# Patient Record
Sex: Female | Born: 1960 | Race: White | Hispanic: No | Marital: Married | Smoking: Never smoker
Health system: Southern US, Community
[De-identification: ages and names within clinical notes are randomized; demographics above are authoritative.]

## PROBLEM LIST (undated history)

## (undated) DIAGNOSIS — J45909 Unspecified asthma, uncomplicated: Secondary | ICD-10-CM

## (undated) DIAGNOSIS — T7840XA Allergy, unspecified, initial encounter: Secondary | ICD-10-CM

## (undated) DIAGNOSIS — R0602 Shortness of breath: Secondary | ICD-10-CM

## (undated) DIAGNOSIS — K219 Gastro-esophageal reflux disease without esophagitis: Secondary | ICD-10-CM

## (undated) HISTORY — PX: COLONOSCOPY: SHX174

## (undated) HISTORY — PX: TONSILLECTOMY: SUR1361

## (undated) HISTORY — DX: Allergy, unspecified, initial encounter: T78.40XA

## (undated) HISTORY — PX: CHOLECYSTECTOMY: SHX55

## (undated) HISTORY — PX: VARICOSE VEIN SURGERY: SHX832

---

## 1999-12-28 ENCOUNTER — Ambulatory Visit (HOSPITAL_COMMUNITY): Admission: RE | Admit: 1999-12-28 | Discharge: 1999-12-28 | Payer: Self-pay | Admitting: Geriatric Medicine

## 2000-06-23 ENCOUNTER — Emergency Department (HOSPITAL_COMMUNITY): Admission: EM | Admit: 2000-06-23 | Discharge: 2000-06-23 | Payer: Self-pay | Admitting: Emergency Medicine

## 2000-08-02 ENCOUNTER — Other Ambulatory Visit: Admission: RE | Admit: 2000-08-02 | Discharge: 2000-08-02 | Payer: Self-pay | Admitting: Obstetrics and Gynecology

## 2001-08-25 ENCOUNTER — Other Ambulatory Visit: Admission: RE | Admit: 2001-08-25 | Discharge: 2001-08-25 | Payer: Self-pay | Admitting: Obstetrics and Gynecology

## 2002-01-15 ENCOUNTER — Encounter: Payer: Self-pay | Admitting: Internal Medicine

## 2002-01-15 ENCOUNTER — Encounter: Admission: RE | Admit: 2002-01-15 | Discharge: 2002-01-15 | Payer: Self-pay | Admitting: Internal Medicine

## 2006-12-26 ENCOUNTER — Encounter: Admission: RE | Admit: 2006-12-26 | Discharge: 2006-12-26 | Payer: Self-pay | Admitting: Internal Medicine

## 2008-03-01 ENCOUNTER — Emergency Department (HOSPITAL_COMMUNITY): Admission: EM | Admit: 2008-03-01 | Discharge: 2008-03-01 | Payer: Self-pay | Admitting: Emergency Medicine

## 2008-03-10 ENCOUNTER — Ambulatory Visit: Payer: Self-pay | Admitting: Internal Medicine

## 2008-03-10 DIAGNOSIS — R933 Abnormal findings on diagnostic imaging of other parts of digestive tract: Secondary | ICD-10-CM

## 2008-03-10 DIAGNOSIS — R935 Abnormal findings on diagnostic imaging of other abdominal regions, including retroperitoneum: Secondary | ICD-10-CM

## 2008-03-10 DIAGNOSIS — R1084 Generalized abdominal pain: Secondary | ICD-10-CM | POA: Insufficient documentation

## 2008-03-23 ENCOUNTER — Ambulatory Visit: Payer: Self-pay | Admitting: Internal Medicine

## 2008-03-23 ENCOUNTER — Encounter: Payer: Self-pay | Admitting: Internal Medicine

## 2008-03-25 ENCOUNTER — Encounter: Payer: Self-pay | Admitting: Internal Medicine

## 2011-04-12 ENCOUNTER — Other Ambulatory Visit (HOSPITAL_COMMUNITY): Payer: Self-pay | Admitting: Internal Medicine

## 2011-04-12 DIAGNOSIS — Z1231 Encounter for screening mammogram for malignant neoplasm of breast: Secondary | ICD-10-CM

## 2011-04-19 ENCOUNTER — Ambulatory Visit (HOSPITAL_COMMUNITY)
Admission: RE | Admit: 2011-04-19 | Discharge: 2011-04-19 | Disposition: A | Discharge status: Home / Self Care | Payer: Self-pay | Source: Ambulatory Visit | Attending: Internal Medicine | Admitting: Internal Medicine

## 2011-04-19 DIAGNOSIS — Z1231 Encounter for screening mammogram for malignant neoplasm of breast: Secondary | ICD-10-CM

## 2011-07-04 LAB — URINALYSIS, ROUTINE W REFLEX MICROSCOPIC
Glucose, UA: NEGATIVE
Hgb urine dipstick: NEGATIVE
Specific Gravity, Urine: 1.025
Urobilinogen, UA: 0.2

## 2011-07-04 LAB — DIFFERENTIAL
Eosinophils Absolute: 0.3
Lymphocytes Relative: 21
Lymphs Abs: 2.5
Monocytes Relative: 7
Neutro Abs: 8.2 — ABNORMAL HIGH
Neutrophils Relative %: 69

## 2011-07-04 LAB — COMPREHENSIVE METABOLIC PANEL
ALT: 36 — ABNORMAL HIGH
AST: 18
Albumin: 4
Alkaline Phosphatase: 63
Calcium: 9.2
GFR calc Af Amer: >60
Glucose, Bld: 124 — ABNORMAL HIGH
Potassium: 4
Sodium: 141
Total Protein: 6.6

## 2011-07-04 LAB — GC/CHLAMYDIA PROBE AMP, GENITAL: GC Probe Amp, Genital: NEGATIVE

## 2011-07-04 LAB — WET PREP, GENITAL
Trich, Wet Prep: NONE SEEN
Yeast Wet Prep HPF POC: NONE SEEN

## 2011-07-04 LAB — CBC
Hemoglobin: 12.1
RBC: 4.01
RDW: 12.2

## 2011-07-04 LAB — POCT PREGNANCY, URINE
Operator id: 231701
Preg Test, Ur: NEGATIVE

## 2011-10-21 ENCOUNTER — Ambulatory Visit: Payer: Self-pay

## 2011-10-21 DIAGNOSIS — J209 Acute bronchitis, unspecified: Secondary | ICD-10-CM

## 2011-10-21 DIAGNOSIS — K219 Gastro-esophageal reflux disease without esophagitis: Secondary | ICD-10-CM

## 2012-04-29 ENCOUNTER — Other Ambulatory Visit (HOSPITAL_COMMUNITY): Payer: Self-pay | Admitting: Internal Medicine

## 2012-04-29 DIAGNOSIS — Z1231 Encounter for screening mammogram for malignant neoplasm of breast: Secondary | ICD-10-CM

## 2012-05-16 ENCOUNTER — Ambulatory Visit (HOSPITAL_COMMUNITY)
Admission: RE | Admit: 2012-05-16 | Discharge: 2012-05-16 | Disposition: A | Discharge status: Home / Self Care | Payer: BC Managed Care – PPO | Source: Ambulatory Visit | Attending: Internal Medicine | Admitting: Internal Medicine

## 2012-05-16 ENCOUNTER — Ambulatory Visit (HOSPITAL_COMMUNITY): Payer: Self-pay

## 2012-05-16 DIAGNOSIS — Z1231 Encounter for screening mammogram for malignant neoplasm of breast: Secondary | ICD-10-CM | POA: Insufficient documentation

## 2012-06-10 ENCOUNTER — Other Ambulatory Visit: Payer: Self-pay | Admitting: Obstetrics and Gynecology

## 2012-06-10 ENCOUNTER — Other Ambulatory Visit (HOSPITAL_COMMUNITY)
Admission: RE | Admit: 2012-06-10 | Discharge: 2012-06-10 | Disposition: A | Discharge status: Home / Self Care | Payer: BC Managed Care – PPO | Source: Ambulatory Visit | Attending: Obstetrics and Gynecology | Admitting: Obstetrics and Gynecology

## 2012-06-10 DIAGNOSIS — Z01419 Encounter for gynecological examination (general) (routine) without abnormal findings: Secondary | ICD-10-CM | POA: Insufficient documentation

## 2012-07-07 ENCOUNTER — Encounter (HOSPITAL_COMMUNITY): Payer: Self-pay | Admitting: Pharmacist

## 2012-07-15 MED FILL — Heparin Sodium (Porcine) Inj 5000 Unit/ML: INTRAMUSCULAR | Qty: 1 | Status: AC

## 2012-07-18 ENCOUNTER — Other Ambulatory Visit: Payer: Self-pay | Admitting: Obstetrics and Gynecology

## 2012-07-21 ENCOUNTER — Encounter (HOSPITAL_COMMUNITY)
Admission: RE | Admit: 2012-07-21 | Discharge: 2012-07-21 | Disposition: A | Discharge status: Home / Self Care | Payer: BC Managed Care – PPO | Source: Ambulatory Visit | Attending: Obstetrics and Gynecology | Admitting: Obstetrics and Gynecology

## 2012-07-21 ENCOUNTER — Encounter (HOSPITAL_COMMUNITY): Payer: Self-pay

## 2012-07-21 HISTORY — DX: Unspecified asthma, uncomplicated: J45.909

## 2012-07-21 HISTORY — DX: Shortness of breath: R06.02

## 2012-07-21 HISTORY — DX: Gastro-esophageal reflux disease without esophagitis: K21.9

## 2012-07-21 LAB — SURGICAL PCR SCREEN
MRSA, PCR: NEGATIVE
Staphylococcus aureus: NEGATIVE

## 2012-07-21 LAB — BASIC METABOLIC PANEL
BUN: 7 mg/dL (ref 6–23)
Calcium: 9.4 mg/dL (ref 8.4–10.5)
Creatinine, Ser: 0.65 mg/dL (ref 0.50–1.10)
GFR calc Af Amer: >90 mL/min (ref >90)
GFR calc non Af Amer: >90 mL/min (ref >90)

## 2012-07-21 LAB — CBC
MCH: 28.8 pg (ref 26.0–34.0)
MCHC: 32.7 g/dL (ref 30.0–36.0)
RDW: 13.2 % (ref 11.5–15.5)

## 2012-07-21 NOTE — Patient Instructions (Signed)
Your procedure is scheduled on:07/30/12  Enter through the Main Entrance at :6am Pick up desk phone and dial [phone] and inform us of your arrival.  Please call [phone] if you have any problems the morning of surgery.  Remember: Do not eat after midnight on Tuesday Do not drink after:midnight Tuesday  Take these meds the morning of surgery with a sip of water:none- bring inhaler to hospital  DO NOT wear jewelry, eye make-up, lipstick,body lotion, or dark fingernail polish. Do not shave for 48 hours prior to surgery.  If you are to be admitted after surgery, leave suitcase in car until your room has been assigned. Patients discharged on the day of surgery will not be allowed to drive home.   Remember to use your Hibiclens as instructed.

## 2012-07-30 ENCOUNTER — Ambulatory Visit (HOSPITAL_COMMUNITY): Payer: BC Managed Care – PPO | Admitting: Anesthesiology

## 2012-07-30 ENCOUNTER — Observation Stay (HOSPITAL_COMMUNITY)
Admission: RE | Admit: 2012-07-30 | Discharge: 2012-07-31 | Disposition: A | Discharge status: Home / Self Care | Payer: BC Managed Care – PPO | Source: Ambulatory Visit | Attending: Obstetrics and Gynecology | Admitting: Obstetrics and Gynecology

## 2012-07-30 ENCOUNTER — Encounter (HOSPITAL_COMMUNITY): Payer: Self-pay | Admitting: Anesthesiology

## 2012-07-30 ENCOUNTER — Encounter (HOSPITAL_COMMUNITY): Payer: Self-pay | Admitting: *Deleted

## 2012-07-30 ENCOUNTER — Encounter (HOSPITAL_COMMUNITY)
Admission: RE | Disposition: A | Discharge status: Home / Self Care | Payer: Self-pay | Source: Ambulatory Visit | Attending: Obstetrics and Gynecology

## 2012-07-30 DIAGNOSIS — N812 Incomplete uterovaginal prolapse: Principal | ICD-10-CM | POA: Insufficient documentation

## 2012-07-30 DIAGNOSIS — N8300 Follicular cyst of ovary, unspecified side: Secondary | ICD-10-CM | POA: Insufficient documentation

## 2012-07-30 HISTORY — PX: SALPINGOOPHORECTOMY: SHX82

## 2012-07-30 HISTORY — PX: VAGINAL HYSTERECTOMY: SHX2639

## 2012-07-30 LAB — PREGNANCY, URINE: Preg Test, Ur: NEGATIVE

## 2012-07-30 SURGERY — HYSTERECTOMY, VAGINAL
Anesthesia: General | Site: Vagina | Wound class: Clean Contaminated

## 2012-07-30 MED ORDER — CEFAZOLIN SODIUM-DEXTROSE 2-3 GM-% IV SOLR
2.0000 g | INTRAVENOUS | Status: AC
  Administered 2012-07-30: 2 g via INTRAVENOUS

## 2012-07-30 MED ORDER — FENTANYL CITRATE 0.05 MG/ML IJ SOLN
INTRAMUSCULAR | Status: AC
  Filled 2012-07-30: qty 2

## 2012-07-30 MED ORDER — FENTANYL CITRATE 0.05 MG/ML IJ SOLN
INTRAMUSCULAR | Status: AC
  Filled 2012-07-30: qty 5

## 2012-07-30 MED ORDER — GLYCOPYRROLATE 0.2 MG/ML IJ SOLN
INTRAMUSCULAR | Status: DC | PRN
  Administered 2012-07-30: 0.4 mg via INTRAVENOUS

## 2012-07-30 MED ORDER — PROMETHAZINE HCL 25 MG/ML IJ SOLN: 6.2500 mg | INTRAMUSCULAR | Status: DC | PRN

## 2012-07-30 MED ORDER — HYDROMORPHONE HCL PF 1 MG/ML IJ SOLN
INTRAMUSCULAR | Status: DC | PRN
  Administered 2012-07-30: 1 mg via INTRAVENOUS

## 2012-07-30 MED ORDER — KETOROLAC TROMETHAMINE 30 MG/ML IJ SOLN
INTRAMUSCULAR | Status: AC
  Filled 2012-07-30: qty 1

## 2012-07-30 MED ORDER — DIPHENHYDRAMINE HCL 50 MG/ML IJ SOLN: 12.5000 mg | Freq: Four times a day (QID) | INTRAMUSCULAR | Status: DC | PRN

## 2012-07-30 MED ORDER — LIDOCAINE HCL (CARDIAC) 20 MG/ML IV SOLN
INTRAVENOUS | Status: DC | PRN
  Administered 2012-07-30 (×2): 20 mg via INTRAVENOUS

## 2012-07-30 MED ORDER — CEFAZOLIN SODIUM-DEXTROSE 2-3 GM-% IV SOLR
INTRAVENOUS | Status: AC
  Filled 2012-07-30: qty 50

## 2012-07-30 MED ORDER — DOCUSATE SODIUM 100 MG PO CAPS
100.0000 mg | ORAL_CAPSULE | Freq: Two times a day (BID) | ORAL | Status: DC
  Administered 2012-07-30 – 2012-07-31 (×3): 100 mg via ORAL
  Filled 2012-07-30 (×3): qty 1

## 2012-07-30 MED ORDER — LACTATED RINGERS IV SOLN
INTRAVENOUS | Status: DC | PRN
  Administered 2012-07-30 (×2): via INTRAVENOUS

## 2012-07-30 MED ORDER — NEOSTIGMINE METHYLSULFATE 1 MG/ML IJ SOLN
INTRAMUSCULAR | Status: DC | PRN
  Administered 2012-07-30: 3 mg via INTRAVENOUS

## 2012-07-30 MED ORDER — DIPHENHYDRAMINE HCL 12.5 MG/5ML PO ELIX: 12.5000 mg | ORAL_SOLUTION | Freq: Four times a day (QID) | ORAL | Status: DC | PRN

## 2012-07-30 MED ORDER — LIDOCAINE-EPINEPHRINE 1 %-1:100000 IJ SOLN
INTRAMUSCULAR | Status: DC | PRN
  Administered 2012-07-30: 30 mL

## 2012-07-30 MED ORDER — ALBUTEROL SULFATE HFA 108 (90 BASE) MCG/ACT IN AERS: 2.0000 | INHALATION_SPRAY | RESPIRATORY_TRACT | Status: DC | PRN

## 2012-07-30 MED ORDER — NALOXONE HCL 0.4 MG/ML IJ SOLN: 0.4000 mg | INTRAMUSCULAR | Status: DC | PRN

## 2012-07-30 MED ORDER — SODIUM CHLORIDE 0.9 % IJ SOLN: 9.0000 mL | INTRAMUSCULAR | Status: DC | PRN

## 2012-07-30 MED ORDER — BISACODYL 10 MG RE SUPP: 10.0000 mg | Freq: Every day | RECTAL | Status: DC | PRN

## 2012-07-30 MED ORDER — PROPOFOL 10 MG/ML IV EMUL
INTRAVENOUS | Status: AC
  Filled 2012-07-30: qty 20

## 2012-07-30 MED ORDER — HYDROMORPHONE 0.3 MG/ML IV SOLN
INTRAVENOUS | Status: DC
  Administered 2012-07-30: 4 mL via INTRAVENOUS
  Administered 2012-07-30: 12:00:00 via INTRAVENOUS
  Filled 2012-07-30: qty 25

## 2012-07-30 MED ORDER — PROPOFOL 10 MG/ML IV EMUL
INTRAVENOUS | Status: DC | PRN
  Administered 2012-07-30: 200 mg via INTRAVENOUS
  Administered 2012-07-30: 100 mg via INTRAVENOUS

## 2012-07-30 MED ORDER — DEXAMETHASONE SODIUM PHOSPHATE 10 MG/ML IJ SOLN
INTRAMUSCULAR | Status: AC
  Filled 2012-07-30: qty 1

## 2012-07-30 MED ORDER — FENTANYL CITRATE 0.05 MG/ML IJ SOLN
INTRAMUSCULAR | Status: DC | PRN
  Administered 2012-07-30 (×3): 50 ug via INTRAVENOUS
  Administered 2012-07-30: 100 ug via INTRAVENOUS
  Administered 2012-07-30 (×2): 50 ug via INTRAVENOUS

## 2012-07-30 MED ORDER — ROCURONIUM BROMIDE 100 MG/10ML IV SOLN
INTRAVENOUS | Status: DC | PRN
  Administered 2012-07-30: 35 mg via INTRAVENOUS
  Administered 2012-07-30: 15 mg via INTRAVENOUS

## 2012-07-30 MED ORDER — LACTATED RINGERS IV SOLN
INTRAVENOUS | Status: DC
  Administered 2012-07-30 – 2012-07-31 (×2): via INTRAVENOUS

## 2012-07-30 MED ORDER — ONDANSETRON HCL 4 MG/2ML IJ SOLN
4.0000 mg | Freq: Four times a day (QID) | INTRAMUSCULAR | Status: DC | PRN
  Filled 2012-07-30: qty 2

## 2012-07-30 MED ORDER — LIDOCAINE HCL (CARDIAC) 20 MG/ML IV SOLN
INTRAVENOUS | Status: AC
  Filled 2012-07-30: qty 5

## 2012-07-30 MED ORDER — OXYCODONE-ACETAMINOPHEN 5-325 MG PO TABS: 1.0000 | ORAL_TABLET | ORAL | Status: DC | PRN

## 2012-07-30 MED ORDER — ONDANSETRON HCL 4 MG/2ML IJ SOLN
4.0000 mg | Freq: Four times a day (QID) | INTRAMUSCULAR | Status: DC | PRN
  Administered 2012-07-30 (×2): 4 mg via INTRAVENOUS
  Filled 2012-07-30: qty 2

## 2012-07-30 MED ORDER — ONDANSETRON HCL 4 MG PO TABS: 4.0000 mg | ORAL_TABLET | Freq: Four times a day (QID) | ORAL | Status: DC | PRN

## 2012-07-30 MED ORDER — MIDAZOLAM HCL 2 MG/2ML IJ SOLN
INTRAMUSCULAR | Status: AC
  Filled 2012-07-30: qty 2

## 2012-07-30 MED ORDER — NEOSTIGMINE METHYLSULFATE 1 MG/ML IJ SOLN
INTRAMUSCULAR | Status: AC
  Filled 2012-07-30: qty 10

## 2012-07-30 MED ORDER — ONDANSETRON HCL 4 MG/2ML IJ SOLN
INTRAMUSCULAR | Status: DC | PRN
  Administered 2012-07-30: 4 mg via INTRAVENOUS

## 2012-07-30 MED ORDER — ONDANSETRON HCL 4 MG/2ML IJ SOLN
INTRAMUSCULAR | Status: AC
  Filled 2012-07-30: qty 2

## 2012-07-30 MED ORDER — MIDAZOLAM HCL 2 MG/2ML IJ SOLN: 0.5000 mg | Freq: Once | INTRAMUSCULAR | Status: DC | PRN

## 2012-07-30 MED ORDER — MORPHINE SULFATE (PF) 1 MG/ML IV SOLN
INTRAVENOUS | Status: DC
  Administered 2012-07-30: 1 mg via INTRAVENOUS
  Administered 2012-07-30: 21:00:00 via INTRAVENOUS
  Administered 2012-07-31 (×2): 1 mg via INTRAVENOUS
  Filled 2012-07-30: qty 25

## 2012-07-30 MED ORDER — GLYCOPYRROLATE 0.2 MG/ML IJ SOLN
INTRAMUSCULAR | Status: AC
  Filled 2012-07-30: qty 2

## 2012-07-30 MED ORDER — LACTATED RINGERS IV SOLN
INTRAVENOUS | Status: DC
Start: 2012-07-30 — End: 2012-07-30
  Administered 2012-07-30 (×2): via INTRAVENOUS

## 2012-07-30 MED ORDER — FENTANYL CITRATE 0.05 MG/ML IJ SOLN: 25.0000 ug | INTRAMUSCULAR | Status: DC | PRN

## 2012-07-30 MED ORDER — HYDROMORPHONE HCL PF 1 MG/ML IJ SOLN
INTRAMUSCULAR | Status: AC
  Filled 2012-07-30: qty 1

## 2012-07-30 MED ORDER — KETOROLAC TROMETHAMINE 30 MG/ML IJ SOLN
30.0000 mg | Freq: Three times a day (TID) | INTRAMUSCULAR | Status: DC
  Administered 2012-07-30 – 2012-07-31 (×2): 30 mg via INTRAVENOUS
  Filled 2012-07-30 (×2): qty 1

## 2012-07-30 MED ORDER — DEXAMETHASONE SODIUM PHOSPHATE 4 MG/ML IJ SOLN
INTRAMUSCULAR | Status: DC | PRN
  Administered 2012-07-30: 10 mg via INTRAVENOUS

## 2012-07-30 MED ORDER — KETOROLAC TROMETHAMINE 30 MG/ML IJ SOLN: 15.0000 mg | Freq: Once | INTRAMUSCULAR | Status: DC | PRN

## 2012-07-30 MED ORDER — ONDANSETRON HCL 4 MG/2ML IJ SOLN
4.0000 mg | Freq: Four times a day (QID) | INTRAMUSCULAR | Status: DC | PRN
Start: 2012-07-30 — End: 2012-07-31

## 2012-07-30 MED ORDER — ESTRADIOL 0.1 MG/GM VA CREA
TOPICAL_CREAM | VAGINAL | Status: DC | PRN
  Administered 2012-07-30: 1 via VAGINAL

## 2012-07-30 MED ORDER — ESTRADIOL 0.1 MG/GM VA CREA
TOPICAL_CREAM | VAGINAL | Status: AC
  Filled 2012-07-30: qty 42.5

## 2012-07-30 MED ORDER — MIDAZOLAM HCL 5 MG/5ML IJ SOLN
INTRAMUSCULAR | Status: DC | PRN
  Administered 2012-07-30: 2 mg via INTRAVENOUS

## 2012-07-30 MED ORDER — ROCURONIUM BROMIDE 50 MG/5ML IV SOLN
INTRAVENOUS | Status: AC
  Filled 2012-07-30: qty 1

## 2012-07-30 MED ORDER — MEPERIDINE HCL 25 MG/ML IJ SOLN: 6.2500 mg | INTRAMUSCULAR | Status: DC | PRN

## 2012-07-30 MED ORDER — INFLUENZA VIRUS VACC SPLIT PF IM SUSP
0.5000 mL | INTRAMUSCULAR | Status: AC
  Administered 2012-07-31: 0.5 mL via INTRAMUSCULAR
  Filled 2012-07-30: qty 0.5

## 2012-07-30 SURGICAL SUPPLY — 32 items
CANISTER SUCTION 2500CC (MISCELLANEOUS) ×3 IMPLANT
CLOTH BEACON ORANGE TIMEOUT ST (SAFETY) ×3 IMPLANT
CONT PATH 16OZ SNAP LID 3702 (MISCELLANEOUS) ×1 IMPLANT
DECANTER SPIKE VIAL GLASS SM (MISCELLANEOUS) ×1 IMPLANT
DRAPE PROXIMA HALF (DRAPES) ×1 IMPLANT
DRAPE STERI URO 9X17 APER PCH (DRAPES) ×3 IMPLANT
DRESSING TELFA 8X3 (GAUZE/BANDAGES/DRESSINGS) ×3 IMPLANT
ELECT LIGASURE LONG (ELECTRODE) IMPLANT
ELECT LIGASURE SHORT 9 REUSE (ELECTRODE) ×1 IMPLANT
GLOVE BIOGEL M 6.5 STRL (GLOVE) ×6 IMPLANT
GLOVE BIOGEL PI IND STRL 6.5 (GLOVE) ×6 IMPLANT
GLOVE BIOGEL PI INDICATOR 6.5 (GLOVE) ×3
GOWN PREVENTION PLUS LG XLONG (DISPOSABLE) ×9 IMPLANT
GOWN PREVENTION PLUS XLARGE (GOWN DISPOSABLE) ×4 IMPLANT
GOWN STRL REIN XL XLG (GOWN DISPOSABLE) ×3 IMPLANT
NDL SPNL 22GX3.5 QUINCKE BK (NEEDLE) IMPLANT
NEEDLE HYPO 22GX1.5 SAFETY (NEEDLE) ×1 IMPLANT
NEEDLE SPNL 22GX3.5 QUINCKE BK (NEEDLE) ×3 IMPLANT
NS IRRIG 1000ML POUR BTL (IV SOLUTION) ×3 IMPLANT
PACK VAGINAL WOMENS (CUSTOM PROCEDURE TRAY) ×3 IMPLANT
PAD OB MATERNITY 4.3X12.25 (PERSONAL CARE ITEMS) ×3 IMPLANT
SUT CHROMIC 2 0 CT 1 (SUTURE) ×6 IMPLANT
SUT VIC AB 0 CT1 18XCR BRD8 (SUTURE) ×6 IMPLANT
SUT VIC AB 0 CT1 36 (SUTURE) ×6 IMPLANT
SUT VIC AB 0 CT1 8-18 (SUTURE) ×9
SUT VIC AB 2-0 CT1 (SUTURE) ×1 IMPLANT
SUT VIC AB 2-0 SH 27 (SUTURE) ×3
SUT VIC AB 2-0 SH 27XBRD (SUTURE) IMPLANT
SUT VICRYL 1 TIES 12X18 (SUTURE) ×3 IMPLANT
TOWEL OR 17X24 6PK STRL BLUE (TOWEL DISPOSABLE) ×6 IMPLANT
TRAY FOLEY CATH 14FR (SET/KITS/TRAYS/PACK) ×3 IMPLANT
WATER STERILE IRR 1000ML POUR (IV SOLUTION) ×2 IMPLANT

## 2012-07-30 NOTE — Anesthesia Postprocedure Evaluation (Signed)
  Anesthesia Post-op Note  Patient: Donna Campbell  Procedure(s) Performed: Procedure(s) (LRB) with comments: HYSTERECTOMY VAGINAL (N/A) SALPINGO OOPHERECTOMY (Left)  Patient Location: PACU  Anesthesia Type: General  Level of Consciousness: awake, alert  and oriented  Airway and Oxygen Therapy: Patient Spontanous Breathing  Post-op Pain: mild  Post-op Assessment: Post-op Vital signs reviewed, Patient's Cardiovascular Status Stable, Respiratory Function Stable, Patent Airway, No signs of Nausea or vomiting and Pain level controlled  Post-op Vital Signs: Reviewed and stable  Complications: No apparent anesthesia complications

## 2012-07-30 NOTE — Anesthesia Postprocedure Evaluation (Signed)
  Anesthesia Post-op Note  Patient: Donna Campbell  Procedure(s) Performed: Procedure(s) (LRB) with comments: HYSTERECTOMY VAGINAL (N/A) SALPINGO OOPHERECTOMY (Left)  Patient Location: [facility]  Anesthesia Type: General  Level of Consciousness: awake, alert  and oriented  Airway and Oxygen Therapy: Patient Spontanous Breathing and Patient connected to nasal cannula oxygen  Post-op Pain: mild  Post-op Assessment: Post-op Vital signs reviewed and Patient's Cardiovascular Status Stable  Post-op Vital Signs: Reviewed and stable  Complications: No apparent anesthesia complications

## 2012-07-30 NOTE — Addendum Note (Signed)
Addendum  created 07/30/12 1620 by Gertie Fey, CRNA   Modules edited:Notes Section

## 2012-07-30 NOTE — Op Note (Signed)
07/30/2012  11:22 AM  PATIENT:  Donna Campbell  51 y.o. female  PRE-OPERATIVE DIAGNOSIS:  Uterine Prolapse  POST-OPERATIVE DIAGNOSIS:  UTERINE PROLAPSE  PROCEDURE:  Procedure(s) (LRB) with comments: HYSTERECTOMY VAGINAL (N/A) SALPINGO OOPHERECTOMY (Left)  Anterior repair  SURGEON:  Surgeon(s) and Role:    Dorien Chihuahua. Richardson Dopp, MD - Primary    * Geryl Rankins, MD - Assisting  PHYSICIAN ASSISTANT:   ASSISTANTS: none   ANESTHESIA:   general  EBL:  Total I/O In: 1800 [I.V.:1800] Out: 500 [Urine:200; Blood:300]  BLOOD ADMINISTERED:none  DRAINS: Urinary Catheter (Foley)   LOCAL MEDICATIONS USED:  LIDOCAINE   SPECIMEN:  Source of Specimen:  uterus and cervix / left fallopian tube and ovary   DISPOSITION OF SPECIMEN:  PATHOLOGY  COUNTS:  YES  TOURNIQUET:  * No tourniquets in log *  DICTATION: .Dragon Dictation  PLAN OF CARE: Admit for overnight observation  PATIENT DISPOSITION:  PACU - hemodynamically stable.   Delay start of Pharmacological VTE agent (>24hrs) due to surgical blood loss or risk of bleeding: not applicable   Procedure: the patient was taken to the operating room placed under general anesthesia. Prepped and draped in the normal sterile fashion. A foley catheter was placed. A weighted speculum was placed in to the vagina and the cervix was grasped with a toothed tenaculum. The cervix was then injected circumferentially with 1% xylocaine with 1:100K of epinephrine. The cervix was then circumferentially incised with the scalpel and the bladder was dissected off the pubovesical cervical fascia. The anterior cul-de-sac as entered sharply. The same procedure was performed posteriorly and the posterior cu-lde-sac was entered sharply without difficulty. A heany clamp was placed over the uterosacral ligaments bilaterally., These were transected and suture ligated with 0 vicryl. The cardinal ligaments were then clamped bilaterally and transected and suture ligated in a  similar fashion.   The uterine arteries and the broad ligament were then serially clamped with ligasure  and transected  bilaterally. Excellent hemostasis was visualized. Both cornu were clamped with heany clamps, transected and the uterus was delivered. Theses pedicles were then suture ligated with excellent hemostasis.  The left fallopian tube and ovary were grasped with babcock clamp. The infundibulopelvic ligament was clamped with the ligasure and the left fallopian tube and ovary were excised .Marland Kitchen Excellent hemostasis was noted. .. The right fallopian tube and ovary were grasped with babcock.. There was minimal descent. A cyst on the left fallopian tube ruptured and the fluid was chocolate colored.. The right adnexa was irrigated copiously... Due to the lack of descent of the left tube and ovary, suspected scarring, I was not able to remove it safely.   The vaginal cuff angles were closed with an angle suture of 0 vicryl and transfixed to the ipsilateral uterosacral ligaments.  A McCall culdoplasty suture of 0 vicryl was placed. The peritoneum was re approximated with 2-0 chromic prior to tying the Mc calls stitch.   Attention was turned to anterior vaginal mucosa. Which was injected with 1% lidocaine with epinephrine.. The mucosa was undermined from the bladder with scissors and incised in the midline to 1 cm below the urethra. The pubovesical cervical fascial defects were re approximated with 2-0 vicryl. The redundant vaginal tissue was excised. The vaginal mucosa was re approximated with 0 vicryl. Vaginal packing with estrace was placed...   All instruments were removed from the vagina and the patient was taken to the recovery room awake and in stable condition.   Sponge lap and  needle counts were correct times 2.

## 2012-07-30 NOTE — Anesthesia Preprocedure Evaluation (Signed)
Anesthesia Evaluation  Patient identified by MRN, date of birth, ID band Patient awake    Reviewed: Allergy & Precautions, H&P , Patient's Chart, lab work & pertinent test results, reviewed documented beta blocker date and time   History of Anesthesia Complications Negative for: history of anesthetic complications  Airway Mallampati: III TM Distance: >3 FB Neck ROM: full    Dental No notable dental hx.    Pulmonary neg pulmonary ROS, shortness of breath, asthma ,  breath sounds clear to auscultation  Pulmonary exam normal       Cardiovascular Exercise Tolerance: Good negative cardio ROS  Rhythm:regular Rate:Normal     Neuro/Psych negative neurological ROS  negative psych ROS   GI/Hepatic negative GI ROS, Neg liver ROS, GERD-  Controlled,  Endo/Other  negative endocrine ROS  Renal/GU negative Renal ROS     Musculoskeletal   Abdominal   Peds  Hematology negative hematology ROS (+)   Anesthesia Other Findings Asthma     Shortness of breath   sometimes on exertion    GERD (gastroesophageal reflux disease)    Reproductive/Obstetrics negative OB ROS                           Anesthesia Physical Anesthesia Plan  ASA: II  Anesthesia Plan: General ETT   Post-op Pain Management:    Induction:   Airway Management Planned:   Additional Equipment:   Intra-op Plan:   Post-operative Plan:   Informed Consent: I have reviewed the patients History and Physical, chart, labs and discussed the procedure including the risks, benefits and alternatives for the proposed anesthesia with the patient or authorized representative who has indicated his/her understanding and acceptance.   Dental Advisory Given  Plan Discussed with: CRNA and Surgeon  Anesthesia Plan Comments:         Anesthesia Quick Evaluation

## 2012-07-30 NOTE — Transfer of Care (Signed)
Immediate Anesthesia Transfer of Care Note  Patient: Donna Campbell  Procedure(s) Performed: Procedure(s) (LRB) with comments: HYSTERECTOMY VAGINAL (N/A) SALPINGO OOPHERECTOMY (Left)  Patient Location: PACU  Anesthesia Type: General  Level of Consciousness: unresponsive  Airway & Oxygen Therapy: Patient Spontanous Breathing and Patient connected to nasal cannula oxygen  Post-op Assessment: Report given to PACU RN and Post -op Vital signs reviewed and stable  Post vital signs: Reviewed and stable  Complications: No apparent anesthesia complications

## 2012-07-30 NOTE — H&P (Signed)
Past Surgical History  Procedure Date  . Cholecystectomy   . Varicose vein surgery   . Tonsillectomy    Date of Initial H&P:07/18/2012 History reviewed, patient examined, no change in status, stable for surgery.

## 2012-07-31 ENCOUNTER — Encounter (HOSPITAL_COMMUNITY): Payer: Self-pay | Admitting: Obstetrics and Gynecology

## 2012-07-31 LAB — CBC
Hemoglobin: 9.8 g/dL — ABNORMAL LOW (ref 12.0–15.0)
MCH: 28.7 pg (ref 26.0–34.0)
MCHC: 32.5 g/dL (ref 30.0–36.0)

## 2012-07-31 MED ORDER — IBUPROFEN 600 MG PO TABS: 600.0000 mg | ORAL_TABLET | Freq: Four times a day (QID) | ORAL | Status: AC | PRN

## 2012-07-31 MED ORDER — OXYCODONE-ACETAMINOPHEN 5-325 MG PO TABS: 1.0000 | ORAL_TABLET | ORAL | Status: AC | PRN

## 2012-07-31 MED ORDER — IBUPROFEN 600 MG PO TABS: 600.0000 mg | ORAL_TABLET | Freq: Four times a day (QID) | ORAL | Status: DC | PRN

## 2012-07-31 MED ORDER — PNEUMOCOCCAL VAC POLYVALENT 25 MCG/0.5ML IJ INJ
0.5000 mL | INJECTION | Freq: Once | INTRAMUSCULAR | Status: AC
  Administered 2012-07-31: 0.5 mL via INTRAMUSCULAR
  Filled 2012-07-31: qty 0.5

## 2012-07-31 NOTE — Progress Notes (Signed)
Subjective: Patient reports nausea. Attributed to Dilaudid overnight.  Relieved with Zofran and now feels better this am.  Blood on vaginal packing when it was removed.  Pt reports some bleeding this am.  Pad that was just removed was minimally soiled.  Pt denies clots or constant bleeding.  Pain is well controlled.  Objective: I have reviewed patient's vital signs, intake and output and labs.  General: alert, cooperative and no distress Resp: clear to auscultation bilaterally Cardio: regular rate and rhythm, S1, S2 normal, no murmur, click, rub or gallop Extremities: extremities normal, atraumatic, no cyanosis or edema Vaginal Bleeding: minimal  Pad inspected and minimal dark blood noted.   Assessment/Plan: S/p TVH, anterior repair.  Doing well. Recommend holding discharge this am to observe vaginal bleeding. Advance to po pain meds. D/c home if bleeding is decreasing and pain is controlled with po pain medications. Pt already has a post op appt with Dr. Richardson Dopp.   LOS: 1 day    Sani Loiseau 07/31/2012, 12:32 PM

## 2012-07-31 NOTE — Progress Notes (Signed)
UR Chart review completed.  

## 2012-07-31 NOTE — Discharge Summary (Signed)
Physician Discharge Summary  Patient ID: Donna Campbell MRN: [id number] DOB/AGE: [birth year] 51 y.o.  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 Blood on vaginal packet noted and slowed by the time of discharge.  N/V with Dilaudid PCA overnight.  Consults: None  Significant Diagnostic Studies: labs: Hg. 9.8 post op  Treatments: surgery: TVH/anterior repair  Discharge Exam: Blood pressure 135/65, pulse 87, temperature 97.6 F (36.4 C), temperature source Oral, resp. rate 18, height 5\' 3"  (1.6 m), weight 97.977 kg (216 lb), SpO2 98.00%. Physical exam: WNL  Disposition: Final discharge disposition not confirmed  Discharge Orders    Future Orders Please Complete By Expires   Diet - low sodium heart healthy      Increase activity slowly      Discharge instructions      Comments:   See discharge instructions.   Sexual Activity Restrictions      Comments:   Pelvic rest   Lifting restrictions      Comments:   No heavy lifting x 3 months.   No wound care      Call MD for:  severe uncontrolled pain      Call MD for:  extreme fatigue      Call MD for:  persistant dizziness or light-headedness          Medication List     As of 07/31/2012 12:31 PM    TAKE these medications         albuterol 108 (90 BASE) MCG/ACT inhaler   Commonly known as: PROVENTIL HFA;VENTOLIN HFA   Inhale 2 puffs into the lungs every 4 (four) hours as needed. For shortness of breath      calcium carbonate 600 MG Tabs   Commonly known as: OS-CAL   Take 600 mg by mouth daily.      cetirizine 10 MG tablet   Commonly known as: ZYRTEC   Take 10 mg by mouth at bedtime.      cholecalciferol 1000 UNITS tablet   Commonly known as: VITAMIN D   Take 1,000 Units by mouth daily.      Coenzyme Q-10 100 MG capsule   Take 100 mg by mouth daily.      Cranberry 400 MG Caps   Take 2,400 mg by mouth daily.      ibuprofen 600 MG tablet   Commonly known as: ADVIL,MOTRIN   Take 1 tablet (600 mg total) by mouth every 6 (six) hours as needed.      multivitamin with minerals Tabs   Take 1 tablet by mouth daily.      oxyCODONE-acetaminophen 5-325 MG per tablet   Commonly known as: PERCOCET/ROXICET   Take 1-2 tablets by mouth every 4 (four) hours as needed (moderate to severe pain (when tolerating fluids)).      simvastatin 40 MG tablet   Commonly known as: ZOCOR   Take 40 mg by mouth every evening.      vitamin B-12 1000 MCG tablet   Commonly known as: CYANOCOBALAMIN   Take 1,000 mcg by mouth daily.           Follow-up Information    Follow up with Jessee Avers., MD. Schedule an appointment as soon as possible for a visit in 2 weeks. (Post op )    Contact information:   [address] [place] [address] [phone]          Signed: Geryl Rankins 07/31/2012, 12:31 PM

## 2012-07-31 NOTE — Progress Notes (Signed)
Discharge instructions reviewed with patient.  Patient states understanding of home care and signs/symptoms to report to MD.  Ambulated to car with staff without incident.  Discharged home with family.

## 2013-04-22 ENCOUNTER — Encounter: Payer: Self-pay | Admitting: Internal Medicine

## 2013-06-05 ENCOUNTER — Other Ambulatory Visit (HOSPITAL_COMMUNITY): Payer: Self-pay | Admitting: Internal Medicine

## 2013-06-05 DIAGNOSIS — Z1231 Encounter for screening mammogram for malignant neoplasm of breast: Secondary | ICD-10-CM

## 2013-06-12 ENCOUNTER — Ambulatory Visit (HOSPITAL_COMMUNITY)
Admission: RE | Admit: 2013-06-12 | Discharge: 2013-06-12 | Disposition: A | Discharge status: Home / Self Care | Payer: BC Managed Care – PPO | Source: Ambulatory Visit | Attending: Internal Medicine | Admitting: Internal Medicine

## 2013-06-12 DIAGNOSIS — Z1231 Encounter for screening mammogram for malignant neoplasm of breast: Secondary | ICD-10-CM

## 2014-04-28 ENCOUNTER — Encounter: Payer: Self-pay | Admitting: Internal Medicine

## 2014-08-03 ENCOUNTER — Other Ambulatory Visit (HOSPITAL_COMMUNITY): Payer: Self-pay | Admitting: Obstetrics and Gynecology

## 2014-08-03 DIAGNOSIS — Z1231 Encounter for screening mammogram for malignant neoplasm of breast: Secondary | ICD-10-CM

## 2014-08-13 ENCOUNTER — Ambulatory Visit (HOSPITAL_COMMUNITY)
Admission: RE | Admit: 2014-08-13 | Discharge: 2014-08-13 | Disposition: A | Discharge status: Home / Self Care | Payer: BC Managed Care – PPO | Source: Ambulatory Visit | Attending: Obstetrics and Gynecology | Admitting: Obstetrics and Gynecology

## 2014-08-13 DIAGNOSIS — Z1231 Encounter for screening mammogram for malignant neoplasm of breast: Secondary | ICD-10-CM | POA: Insufficient documentation

## 2015-02-15 ENCOUNTER — Ambulatory Visit (INDEPENDENT_AMBULATORY_CARE_PROVIDER_SITE_OTHER): Payer: BLUE CROSS/BLUE SHIELD | Admitting: Physician Assistant

## 2015-02-15 VITALS — BP 118/88 | HR 85 | Temp 97.7°F | Resp 18 | Ht 63.5 in | Wt 228.2 lb

## 2015-02-15 DIAGNOSIS — J069 Acute upper respiratory infection, unspecified: Secondary | ICD-10-CM

## 2015-02-15 DIAGNOSIS — R0981 Nasal congestion: Secondary | ICD-10-CM | POA: Diagnosis not present

## 2015-02-15 DIAGNOSIS — J309 Allergic rhinitis, unspecified: Secondary | ICD-10-CM | POA: Diagnosis not present

## 2015-02-15 MED ORDER — IPRATROPIUM BROMIDE 0.03 % NA SOLN: 2.0000 | Freq: Two times a day (BID) | NASAL | Status: AC

## 2015-02-15 NOTE — Patient Instructions (Addendum)
 10mg  of the cetirizine, you will take that daily.   Mucinex 1200mg  every 12 hours.   Drink plenty water (64oz per day which is about 4 regular sized water bottles)

## 2015-02-15 NOTE — Progress Notes (Signed)
[facility] [address], [place] [address] [phone]  Date:  02/15/2015   Name:  Donna Campbell   DOB:  12/17/1960   MRN:  [id number]  PCP:  Georgann HousekeeperHUSAIN,KARRAR, MD    Chief Complaint: Nasal Congestion; head congestion; and Sore Throat   History of Present Illness:  Donna Campbell is a 54 y.o. very pleasant female patient who presents with the following:   Throat pain with coughing that is worsened in the morning.  She has a wet cough with yellow sputum in the morning only.  She has no fever, chills, or fatigue.  There is no runny nose, but is congested.  She has no abdominal pain, nausea, or vomiting.  She drinks very little water, but mostly teas and sodas. She has allergies for which she treats symptomatically.  Patient Active Problem List   Diagnosis Date Noted  . 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 ABDOMINAL PAIN-GENERALIZED 03/10/2008  . ABNORMAL FINDINGS GI TRACT 03/10/2008  . NONSPEC ABN FIND-RADIOLOGY-OTH EXAM OF ABD AREA 03/10/2008    Past Medical History  Diagnosis Date  . Asthma   . Shortness of breath     sometimes on exertion  . GERD (gastroesophageal reflux disease)   . Allergy     Past Surgical History  Procedure Laterality Date  . Cholecystectomy    . Varicose vein surgery    . Tonsillectomy    . Vaginal hysterectomy  07/30/2012    Procedure: HYSTERECTOMY VAGINAL;  Surgeon: Dorien Chihuahuaara J. Richardson Doppole, MD;  Location: [facility];  Service: Gynecology;  Laterality: N/A;  . Salpingoophorectomy  07/30/2012    Procedure: SALPINGO OOPHERECTOMY;  Surgeon: Dorien Chihuahuaara J. Richardson Doppole, MD;  Location: [facility];  Service: Gynecology;  Laterality: Left;  . Colonoscopy      History  Substance Use Topics  . Smoking status: Never Smoker   . Smokeless tobacco: Not on file  . Alcohol Use: No    History reviewed. No pertinent family history.  Allergies  Allergen Reactions  . Naproxen Hives    Pt says she can take ibuprofen  . Sulfonamide  Derivatives Hives    Medication list has been reviewed and updated.  Current Outpatient Prescriptions on File Prior to Visit  Medication Sig Dispense Refill  . albuterol (PROVENTIL HFA;VENTOLIN HFA) 108 (90 BASE) MCG/ACT inhaler Inhale 2 puffs into the lungs every 4 (four) hours as needed. For shortness of breath    . calcium carbonate (OS-CAL) 600 MG TABS Take 600 mg by mouth daily.    . cetirizine (ZYRTEC) 10 MG tablet Take 10 mg by mouth at bedtime.    . cholecalciferol (VITAMIN D) 1000 UNITS tablet Take 1,000 Units by mouth daily.    . Coenzyme Q-10 100 MG capsule Take 100 mg by mouth daily.    . Cranberry 400 MG CAPS Take 2,400 mg by mouth daily.    . Multiple Vitamin (MULTIVITAMIN WITH MINERALS) TABS Take 1 tablet by mouth daily.    . simvastatin (ZOCOR) 40 MG tablet Take 40 mg by mouth every evening.    . vitamin B-12 (CYANOCOBALAMIN) 1000 MCG tablet Take 1,000 mcg by mouth daily.    Marland Kitchen. ibuprofen (ADVIL,MOTRIN) 600 MG tablet Take 1 tablet (600 mg total) by mouth every 6 (six) hours as needed. (Patient not taking: Reported on 02/15/2015) 30 tablet 1  . oxyCODONE-acetaminophen (PERCOCET/ROXICET) 5-325 MG per tablet Take 1-2 tablets by mouth every 4 (four) hours as needed (moderate to severe  pain (when tolerating fluids)). (Patient not taking: Reported on 02/15/2015) 30 tablet 0   No current facility-administered medications on file prior to visit.    Review of Systems: ROS otherwise unremarkable unless listed above.  Physical Examination: Filed Vitals:   02/15/15 1511  BP: 118/88  Pulse: 85  Temp: 97.7 F (36.5 C)  Resp: 18   Filed Vitals:   02/15/15 1511  Height: 5' 3.5" (1.613 m)  Weight: 228 lb 3.2 oz (103.511 kg)   Body mass index is 39.78 kg/(m^2). Ideal Body Weight: Weight in (lb) to have BMI = 25: 143.1  Physical Exam  Constitutional: She is oriented to person, place, and time. She appears well-developed and well-nourished. No distress.  HENT:  Head:  Normocephalic and atraumatic.  Right Ear: Tympanic membrane, external ear and ear canal normal.  Left Ear: Tympanic membrane, external ear and ear canal normal.  Nose: Mucosal edema and rhinorrhea present. Right sinus exhibits no maxillary sinus tenderness and no frontal sinus tenderness. Left sinus exhibits no maxillary sinus tenderness and no frontal sinus tenderness.  Mouth/Throat: No oropharyngeal exudate, posterior oropharyngeal edema or posterior oropharyngeal erythema.  Eyes: EOM are normal. Pupils are equal, round, and reactive to light. Right conjunctiva is not injected. Left conjunctiva is not injected.  Cardiovascular: Normal rate, regular rhythm and normal heart sounds.  Exam reveals no friction rub.   No murmur heard. Pulmonary/Chest: Effort normal and breath sounds normal. No respiratory distress. She has no wheezes.  Abdominal: Soft. Bowel sounds are normal. She exhibits no distension.  Lymphadenopathy:       Head (right side): No submandibular, no tonsillar and no preauricular adenopathy present.       Head (left side): No submandibular, no tonsillar and no preauricular adenopathy present.    She has no cervical adenopathy.  Neurological: She is alert and oriented to person, place, and time.  Skin: Skin is warm and dry.  Psychiatric: She has a normal mood and affect. Her behavior is normal.     Assessment and Plan: 54 year old female is here today for nasal congestion, head congestion, and sore throat. This appears to be allergic rhinitis, or viral URI.  She has cetirizine at home.  Advised to start 10 mg of cetirizine, and mucinex 1200mg  ever 12 hours.  Also advised hydration.  RTC if symptoms do not resolve in 4 days, or contact to start abx and/or further workup.   Allergic rhinitis, unspecified allergic rhinitis type  Viral URI  Nasal congestion - Plan: ipratropium (ATROVENT) 0.03 % nasal spray   Trena PlattStephanie Stephaie Dardis, PA-C [facility] 5/10/20168:57 PM

## 2015-08-11 ENCOUNTER — Other Ambulatory Visit: Payer: Self-pay

## 2015-08-11 DIAGNOSIS — Z1231 Encounter for screening mammogram for malignant neoplasm of breast: Secondary | ICD-10-CM

## 2015-09-06 ENCOUNTER — Ambulatory Visit
Admission: RE | Admit: 2015-09-06 | Discharge: 2015-09-06 | Disposition: A | Discharge status: Home / Self Care | Payer: PRIVATE HEALTH INSURANCE | Source: Ambulatory Visit

## 2015-09-06 DIAGNOSIS — Z1231 Encounter for screening mammogram for malignant neoplasm of breast: Secondary | ICD-10-CM

## 2016-04-16 DIAGNOSIS — Z1389 Encounter for screening for other disorder: Secondary | ICD-10-CM | POA: Diagnosis not present

## 2016-04-16 DIAGNOSIS — R7309 Other abnormal glucose: Secondary | ICD-10-CM | POA: Diagnosis not present

## 2016-04-16 DIAGNOSIS — Z0000 Encounter for general adult medical examination without abnormal findings: Secondary | ICD-10-CM | POA: Diagnosis not present

## 2016-04-16 DIAGNOSIS — Z23 Encounter for immunization: Secondary | ICD-10-CM | POA: Diagnosis not present

## 2016-04-16 DIAGNOSIS — E7800 Pure hypercholesterolemia, unspecified: Secondary | ICD-10-CM | POA: Diagnosis not present

## 2016-06-08 DIAGNOSIS — M25562 Pain in left knee: Secondary | ICD-10-CM | POA: Diagnosis not present

## 2016-06-08 DIAGNOSIS — M2242 Chondromalacia patellae, left knee: Secondary | ICD-10-CM | POA: Diagnosis not present

## 2016-06-08 DIAGNOSIS — M7122 Synovial cyst of popliteal space [Baker], left knee: Secondary | ICD-10-CM | POA: Diagnosis not present

## 2016-07-09 DIAGNOSIS — M2242 Chondromalacia patellae, left knee: Secondary | ICD-10-CM | POA: Diagnosis not present

## 2016-07-09 DIAGNOSIS — M25562 Pain in left knee: Secondary | ICD-10-CM | POA: Diagnosis not present

## 2016-07-20 DIAGNOSIS — M2242 Chondromalacia patellae, left knee: Secondary | ICD-10-CM | POA: Diagnosis not present

## 2016-07-24 DIAGNOSIS — M2242 Chondromalacia patellae, left knee: Secondary | ICD-10-CM | POA: Diagnosis not present

## 2016-07-27 DIAGNOSIS — M2242 Chondromalacia patellae, left knee: Secondary | ICD-10-CM | POA: Diagnosis not present

## 2016-07-31 DIAGNOSIS — M2242 Chondromalacia patellae, left knee: Secondary | ICD-10-CM | POA: Diagnosis not present

## 2016-08-03 DIAGNOSIS — M2242 Chondromalacia patellae, left knee: Secondary | ICD-10-CM | POA: Diagnosis not present

## 2016-08-07 DIAGNOSIS — M2242 Chondromalacia patellae, left knee: Secondary | ICD-10-CM | POA: Diagnosis not present

## 2016-08-10 ENCOUNTER — Other Ambulatory Visit: Payer: Self-pay | Admitting: Internal Medicine

## 2016-08-10 DIAGNOSIS — Z1231 Encounter for screening mammogram for malignant neoplasm of breast: Secondary | ICD-10-CM

## 2016-08-13 DIAGNOSIS — Z01419 Encounter for gynecological examination (general) (routine) without abnormal findings: Secondary | ICD-10-CM | POA: Diagnosis not present

## 2016-08-13 DIAGNOSIS — Z23 Encounter for immunization: Secondary | ICD-10-CM | POA: Diagnosis not present

## 2016-09-07 ENCOUNTER — Ambulatory Visit
Admission: RE | Admit: 2016-09-07 | Discharge: 2016-09-07 | Disposition: A | Discharge status: Home / Self Care | Payer: BLUE CROSS/BLUE SHIELD | Source: Ambulatory Visit | Attending: Internal Medicine | Admitting: Internal Medicine

## 2016-09-07 DIAGNOSIS — Z1231 Encounter for screening mammogram for malignant neoplasm of breast: Secondary | ICD-10-CM

## 2017-08-13 ENCOUNTER — Other Ambulatory Visit: Payer: Self-pay | Admitting: Internal Medicine

## 2017-08-13 DIAGNOSIS — Z1231 Encounter for screening mammogram for malignant neoplasm of breast: Secondary | ICD-10-CM

## 2017-08-14 DIAGNOSIS — Z01411 Encounter for gynecological examination (general) (routine) with abnormal findings: Secondary | ICD-10-CM | POA: Diagnosis not present

## 2017-08-14 DIAGNOSIS — N951 Menopausal and female climacteric states: Secondary | ICD-10-CM | POA: Diagnosis not present

## 2017-09-12 ENCOUNTER — Ambulatory Visit
Admission: RE | Admit: 2017-09-12 | Discharge: 2017-09-12 | Disposition: A | Discharge status: Home / Self Care | Payer: BLUE CROSS/BLUE SHIELD | Source: Ambulatory Visit | Attending: Internal Medicine | Admitting: Internal Medicine

## 2017-09-12 DIAGNOSIS — Z1231 Encounter for screening mammogram for malignant neoplasm of breast: Secondary | ICD-10-CM

## 2017-11-14 DIAGNOSIS — N951 Menopausal and female climacteric states: Secondary | ICD-10-CM | POA: Diagnosis not present

## 2018-08-08 ENCOUNTER — Other Ambulatory Visit: Payer: Self-pay | Admitting: Internal Medicine

## 2018-08-08 DIAGNOSIS — Z1231 Encounter for screening mammogram for malignant neoplasm of breast: Secondary | ICD-10-CM

## 2018-08-18 DIAGNOSIS — R300 Dysuria: Secondary | ICD-10-CM | POA: Diagnosis not present

## 2018-08-18 DIAGNOSIS — Z01419 Encounter for gynecological examination (general) (routine) without abnormal findings: Secondary | ICD-10-CM | POA: Diagnosis not present

## 2018-08-18 DIAGNOSIS — N951 Menopausal and female climacteric states: Secondary | ICD-10-CM | POA: Diagnosis not present

## 2018-08-18 DIAGNOSIS — R102 Pelvic and perineal pain: Secondary | ICD-10-CM | POA: Diagnosis not present

## 2018-08-26 DIAGNOSIS — Z0000 Encounter for general adult medical examination without abnormal findings: Secondary | ICD-10-CM | POA: Diagnosis not present

## 2018-08-26 DIAGNOSIS — E7800 Pure hypercholesterolemia, unspecified: Secondary | ICD-10-CM | POA: Diagnosis not present

## 2018-08-26 DIAGNOSIS — R7303 Prediabetes: Secondary | ICD-10-CM | POA: Diagnosis not present

## 2018-09-23 ENCOUNTER — Ambulatory Visit
Admission: RE | Admit: 2018-09-23 | Discharge: 2018-09-23 | Disposition: A | Discharge status: Home / Self Care | Payer: BLUE CROSS/BLUE SHIELD | Source: Ambulatory Visit | Attending: Internal Medicine | Admitting: Internal Medicine

## 2018-09-23 DIAGNOSIS — Z1231 Encounter for screening mammogram for malignant neoplasm of breast: Secondary | ICD-10-CM | POA: Diagnosis not present

## 2019-08-24 DIAGNOSIS — Z01419 Encounter for gynecological examination (general) (routine) without abnormal findings: Secondary | ICD-10-CM | POA: Diagnosis not present

## 2019-08-28 DIAGNOSIS — E559 Vitamin D deficiency, unspecified: Secondary | ICD-10-CM | POA: Diagnosis not present

## 2019-08-28 DIAGNOSIS — Z131 Encounter for screening for diabetes mellitus: Secondary | ICD-10-CM | POA: Diagnosis not present

## 2019-08-28 DIAGNOSIS — Z23 Encounter for immunization: Secondary | ICD-10-CM | POA: Diagnosis not present

## 2019-08-28 DIAGNOSIS — E7800 Pure hypercholesterolemia, unspecified: Secondary | ICD-10-CM | POA: Diagnosis not present

## 2019-08-28 DIAGNOSIS — Z0000 Encounter for general adult medical examination without abnormal findings: Secondary | ICD-10-CM | POA: Diagnosis not present

## 2019-09-16 ENCOUNTER — Other Ambulatory Visit: Payer: Self-pay | Admitting: Internal Medicine

## 2019-09-16 ENCOUNTER — Encounter: Payer: Self-pay | Admitting: Internal Medicine

## 2019-09-16 DIAGNOSIS — Z1231 Encounter for screening mammogram for malignant neoplasm of breast: Secondary | ICD-10-CM

## 2019-11-04 ENCOUNTER — Other Ambulatory Visit: Payer: Self-pay

## 2019-11-04 ENCOUNTER — Ambulatory Visit
Admission: RE | Admit: 2019-11-04 | Discharge: 2019-11-04 | Disposition: A | Discharge status: Home / Self Care | Payer: BC Managed Care – PPO | Source: Ambulatory Visit | Attending: Internal Medicine | Admitting: Internal Medicine

## 2019-11-04 DIAGNOSIS — Z1231 Encounter for screening mammogram for malignant neoplasm of breast: Secondary | ICD-10-CM | POA: Diagnosis not present

## 2020-04-04 DIAGNOSIS — J309 Allergic rhinitis, unspecified: Secondary | ICD-10-CM | POA: Diagnosis not present

## 2020-04-04 DIAGNOSIS — E7800 Pure hypercholesterolemia, unspecified: Secondary | ICD-10-CM | POA: Diagnosis not present

## 2020-04-04 DIAGNOSIS — R7303 Prediabetes: Secondary | ICD-10-CM | POA: Diagnosis not present

## 2020-04-04 DIAGNOSIS — F418 Other specified anxiety disorders: Secondary | ICD-10-CM | POA: Diagnosis not present

## 2020-04-26 DIAGNOSIS — U071 COVID-19: Secondary | ICD-10-CM | POA: Diagnosis not present

## 2020-05-01 ENCOUNTER — Emergency Department (HOSPITAL_COMMUNITY): Payer: BC Managed Care – PPO

## 2020-05-01 ENCOUNTER — Inpatient Hospital Stay (HOSPITAL_COMMUNITY): Payer: BC Managed Care – PPO

## 2020-05-01 ENCOUNTER — Inpatient Hospital Stay (HOSPITAL_COMMUNITY)
Admission: EM | Admit: 2020-05-01 | Discharge: 2020-06-08 | DRG: 207 | Disposition: E | Payer: BC Managed Care – PPO | Attending: Pulmonary Disease | Admitting: Pulmonary Disease

## 2020-05-01 ENCOUNTER — Other Ambulatory Visit: Payer: Self-pay

## 2020-05-01 DIAGNOSIS — D696 Thrombocytopenia, unspecified: Secondary | ICD-10-CM | POA: Diagnosis not present

## 2020-05-01 DIAGNOSIS — R918 Other nonspecific abnormal finding of lung field: Secondary | ICD-10-CM | POA: Diagnosis not present

## 2020-05-01 DIAGNOSIS — E870 Hyperosmolality and hypernatremia: Secondary | ICD-10-CM | POA: Diagnosis not present

## 2020-05-01 DIAGNOSIS — R21 Rash and other nonspecific skin eruption: Secondary | ICD-10-CM | POA: Diagnosis not present

## 2020-05-01 DIAGNOSIS — R001 Bradycardia, unspecified: Secondary | ICD-10-CM | POA: Diagnosis not present

## 2020-05-01 DIAGNOSIS — I21A1 Myocardial infarction type 2: Secondary | ICD-10-CM | POA: Diagnosis not present

## 2020-05-01 DIAGNOSIS — Z4682 Encounter for fitting and adjustment of non-vascular catheter: Secondary | ICD-10-CM | POA: Diagnosis not present

## 2020-05-01 DIAGNOSIS — J1282 Pneumonia due to coronavirus disease 2019: Secondary | ICD-10-CM | POA: Diagnosis present

## 2020-05-01 DIAGNOSIS — D649 Anemia, unspecified: Secondary | ICD-10-CM | POA: Diagnosis present

## 2020-05-01 DIAGNOSIS — J9601 Acute respiratory failure with hypoxia: Secondary | ICD-10-CM

## 2020-05-01 DIAGNOSIS — R739 Hyperglycemia, unspecified: Secondary | ICD-10-CM | POA: Diagnosis present

## 2020-05-01 DIAGNOSIS — R112 Nausea with vomiting, unspecified: Secondary | ICD-10-CM

## 2020-05-01 DIAGNOSIS — J439 Emphysema, unspecified: Secondary | ICD-10-CM | POA: Diagnosis not present

## 2020-05-01 DIAGNOSIS — Z4659 Encounter for fitting and adjustment of other gastrointestinal appliance and device: Secondary | ICD-10-CM

## 2020-05-01 DIAGNOSIS — I469 Cardiac arrest, cause unspecified: Secondary | ICD-10-CM | POA: Diagnosis not present

## 2020-05-01 DIAGNOSIS — J984 Other disorders of lung: Secondary | ICD-10-CM | POA: Diagnosis not present

## 2020-05-01 DIAGNOSIS — R0902 Hypoxemia: Secondary | ICD-10-CM

## 2020-05-01 DIAGNOSIS — Z66 Do not resuscitate: Secondary | ICD-10-CM | POA: Diagnosis not present

## 2020-05-01 DIAGNOSIS — J45909 Unspecified asthma, uncomplicated: Secondary | ICD-10-CM | POA: Diagnosis present

## 2020-05-01 DIAGNOSIS — E875 Hyperkalemia: Secondary | ICD-10-CM | POA: Diagnosis not present

## 2020-05-01 DIAGNOSIS — J15212 Pneumonia due to Methicillin resistant Staphylococcus aureus: Secondary | ICD-10-CM | POA: Diagnosis not present

## 2020-05-01 DIAGNOSIS — J9690 Respiratory failure, unspecified, unspecified whether with hypoxia or hypercapnia: Secondary | ICD-10-CM

## 2020-05-01 DIAGNOSIS — Z79899 Other long term (current) drug therapy: Secondary | ICD-10-CM

## 2020-05-01 DIAGNOSIS — E669 Obesity, unspecified: Secondary | ICD-10-CM | POA: Diagnosis present

## 2020-05-01 DIAGNOSIS — R0602 Shortness of breath: Secondary | ICD-10-CM | POA: Diagnosis not present

## 2020-05-01 DIAGNOSIS — U071 COVID-19: Secondary | ICD-10-CM | POA: Diagnosis not present

## 2020-05-01 DIAGNOSIS — Z452 Encounter for adjustment and management of vascular access device: Secondary | ICD-10-CM | POA: Diagnosis not present

## 2020-05-01 DIAGNOSIS — I4901 Ventricular fibrillation: Secondary | ICD-10-CM | POA: Diagnosis not present

## 2020-05-01 DIAGNOSIS — Z6839 Body mass index (BMI) 39.0-39.9, adult: Secondary | ICD-10-CM

## 2020-05-01 DIAGNOSIS — K219 Gastro-esophageal reflux disease without esophagitis: Secondary | ICD-10-CM | POA: Diagnosis present

## 2020-05-01 DIAGNOSIS — N170 Acute kidney failure with tubular necrosis: Secondary | ICD-10-CM | POA: Diagnosis not present

## 2020-05-01 DIAGNOSIS — Z01818 Encounter for other preprocedural examination: Secondary | ICD-10-CM

## 2020-05-01 DIAGNOSIS — Z9911 Dependence on respirator [ventilator] status: Secondary | ICD-10-CM

## 2020-05-01 DIAGNOSIS — Z888 Allergy status to other drugs, medicaments and biological substances status: Secondary | ICD-10-CM

## 2020-05-01 DIAGNOSIS — E8770 Fluid overload, unspecified: Secondary | ICD-10-CM | POA: Diagnosis not present

## 2020-05-01 DIAGNOSIS — I4891 Unspecified atrial fibrillation: Secondary | ICD-10-CM | POA: Diagnosis not present

## 2020-05-01 DIAGNOSIS — J9602 Acute respiratory failure with hypercapnia: Secondary | ICD-10-CM

## 2020-05-01 DIAGNOSIS — N179 Acute kidney failure, unspecified: Secondary | ICD-10-CM | POA: Diagnosis not present

## 2020-05-01 DIAGNOSIS — R0600 Dyspnea, unspecified: Secondary | ICD-10-CM | POA: Diagnosis not present

## 2020-05-01 DIAGNOSIS — Z0189 Encounter for other specified special examinations: Secondary | ICD-10-CM

## 2020-05-01 DIAGNOSIS — J9383 Other pneumothorax: Secondary | ICD-10-CM | POA: Diagnosis not present

## 2020-05-01 DIAGNOSIS — J80 Acute respiratory distress syndrome: Secondary | ICD-10-CM | POA: Diagnosis not present

## 2020-05-01 DIAGNOSIS — J9600 Acute respiratory failure, unspecified whether with hypoxia or hypercapnia: Secondary | ICD-10-CM | POA: Diagnosis not present

## 2020-05-01 DIAGNOSIS — B3324 Viral cardiomyopathy: Secondary | ICD-10-CM | POA: Diagnosis present

## 2020-05-01 DIAGNOSIS — J939 Pneumothorax, unspecified: Secondary | ICD-10-CM | POA: Diagnosis not present

## 2020-05-01 DIAGNOSIS — Z978 Presence of other specified devices: Secondary | ICD-10-CM

## 2020-05-01 DIAGNOSIS — J9811 Atelectasis: Secondary | ICD-10-CM | POA: Diagnosis not present

## 2020-05-01 DIAGNOSIS — N3289 Other specified disorders of bladder: Secondary | ICD-10-CM | POA: Diagnosis not present

## 2020-05-01 DIAGNOSIS — K760 Fatty (change of) liver, not elsewhere classified: Secondary | ICD-10-CM | POA: Diagnosis not present

## 2020-05-01 DIAGNOSIS — J189 Pneumonia, unspecified organism: Secondary | ICD-10-CM | POA: Diagnosis not present

## 2020-05-01 DIAGNOSIS — J90 Pleural effusion, not elsewhere classified: Secondary | ICD-10-CM | POA: Diagnosis not present

## 2020-05-01 DIAGNOSIS — Z882 Allergy status to sulfonamides status: Secondary | ICD-10-CM

## 2020-05-01 DIAGNOSIS — Z9689 Presence of other specified functional implants: Secondary | ICD-10-CM

## 2020-05-01 DIAGNOSIS — R1310 Dysphagia, unspecified: Secondary | ICD-10-CM

## 2020-05-01 LAB — CBC WITH DIFFERENTIAL/PLATELET
Abs Immature Granulocytes: 0.07 K/uL (ref 0.00–0.07)
Basophils Absolute: 0 K/uL (ref 0.0–0.1)
Basophils Relative: 0 %
Eosinophils Absolute: 0 K/uL (ref 0.0–0.5)
Eosinophils Relative: 0 %
HCT: 41.4 % (ref 36.0–46.0)
Hemoglobin: 13.7 g/dL (ref 12.0–15.0)
Immature Granulocytes: 1 %
Lymphocytes Relative: 23 %
Lymphs Abs: 2.3 K/uL (ref 0.7–4.0)
MCH: 29.7 pg (ref 26.0–34.0)
MCHC: 33.1 g/dL (ref 30.0–36.0)
MCV: 89.8 fL (ref 80.0–100.0)
Monocytes Absolute: 0.6 K/uL (ref 0.1–1.0)
Monocytes Relative: 6 %
Neutro Abs: 6.8 K/uL (ref 1.7–7.7)
Neutrophils Relative %: 70 %
Platelets: 323 K/uL (ref 150–400)
RBC: 4.61 MIL/uL (ref 3.87–5.11)
RDW: 13.9 % (ref 11.5–15.5)
WBC: 9.7 K/uL (ref 4.0–10.5)
nRBC: 0.2 % (ref 0.0–0.2)

## 2020-05-01 LAB — BLOOD GAS, ARTERIAL
Acid-base deficit: 9.1 mmol/L — ABNORMAL HIGH (ref 0.0–2.0)
Acid-base deficit: 8.2 mmol/L — ABNORMAL HIGH (ref 0.0–2.0)
Bicarbonate: 20 mmol/L (ref 20.0–28.0)
Bicarbonate: 17.4 mmol/L — ABNORMAL LOW (ref 20.0–28.0)
FIO2: 100
MECHVT: 330 mL
O2 Saturation: 76.3 %
O2 Saturation: 83.4 %
PEEP: 16 cmH2O
Patient temperature: 98.6
Patient temperature: 98.6
RATE: 26 {breaths}/min
pCO2 arterial: 57.7 mmHg — ABNORMAL HIGH (ref 32.0–48.0)
pCO2 arterial: 38.2 mmHg (ref 32.0–48.0)
pH, Arterial: 7.165 — CL (ref 7.350–7.450)
pH, Arterial: 7.282 — ABNORMAL LOW (ref 7.350–7.450)
pO2, Arterial: 58.8 mmHg — ABNORMAL LOW (ref 83.0–108.0)
pO2, Arterial: 58.5 mmHg — ABNORMAL LOW (ref 83.0–108.0)

## 2020-05-01 LAB — HIV ANTIBODY (ROUTINE TESTING W REFLEX): HIV Screen 4th Generation wRfx: NONREACTIVE

## 2020-05-01 LAB — COMPREHENSIVE METABOLIC PANEL
ALT: 61 U/L — ABNORMAL HIGH (ref 0–44)
AST: 89 U/L — ABNORMAL HIGH (ref 15–41)
Albumin: 3.5 g/dL (ref 3.5–5.0)
Alkaline Phosphatase: 76 U/L (ref 38–126)
Anion gap: 20 — ABNORMAL HIGH (ref 5–15)
BUN: 27 mg/dL — ABNORMAL HIGH (ref 6–20)
CO2: 15 mmol/L — ABNORMAL LOW (ref 22–32)
Calcium: 9.4 mg/dL (ref 8.9–10.3)
Chloride: 100 mmol/L (ref 98–111)
Creatinine, Ser: 1.14 mg/dL — ABNORMAL HIGH (ref 0.44–1.00)
GFR calc Af Amer: >60 mL/min (ref >60)
GFR calc non Af Amer: 53 mL/min — ABNORMAL LOW (ref >60)
Glucose, Bld: 224 mg/dL — ABNORMAL HIGH (ref 70–99)
Potassium: 3.9 mmol/L (ref 3.5–5.1)
Sodium: 135 mmol/L (ref 135–145)
Total Bilirubin: 0.5 mg/dL (ref 0.3–1.2)
Total Protein: 8.2 g/dL — ABNORMAL HIGH (ref 6.5–8.1)

## 2020-05-01 LAB — GLUCOSE, CAPILLARY: Glucose-Capillary: 166 mg/dL — ABNORMAL HIGH (ref 70–99)

## 2020-05-01 LAB — FIBRINOGEN: Fibrinogen: 712 mg/dL — ABNORMAL HIGH (ref 210–475)

## 2020-05-01 LAB — PHOSPHORUS: Phosphorus: 7.5 mg/dL — ABNORMAL HIGH (ref 2.5–4.6)

## 2020-05-01 LAB — PROCALCITONIN: Procalcitonin: 0.14 ng/mL

## 2020-05-01 LAB — MAGNESIUM: Magnesium: 2.4 mg/dL (ref 1.7–2.4)

## 2020-05-01 LAB — FERRITIN: Ferritin: 2369 ng/mL — ABNORMAL HIGH (ref 11–307)

## 2020-05-01 LAB — LACTATE DEHYDROGENASE: LDH: 904 U/L — ABNORMAL HIGH (ref 98–192)

## 2020-05-01 LAB — TROPONIN I (HIGH SENSITIVITY): Troponin I (High Sensitivity): 67 ng/L — ABNORMAL HIGH (ref <18)

## 2020-05-01 LAB — C-REACTIVE PROTEIN: CRP: 9.9 mg/dL — ABNORMAL HIGH

## 2020-05-01 LAB — BRAIN NATRIURETIC PEPTIDE: B Natriuretic Peptide: 84.6 pg/mL (ref 0.0–100.0)

## 2020-05-01 LAB — D-DIMER, QUANTITATIVE: D-Dimer, Quant: 2.73 ug/mL-FEU — ABNORMAL HIGH (ref 0.00–0.50)

## 2020-05-01 LAB — HEPATITIS B SURFACE ANTIGEN: Hepatitis B Surface Ag: NONREACTIVE

## 2020-05-01 MED ORDER — ROCURONIUM BROMIDE 10 MG/ML (PF) SYRINGE
PREFILLED_SYRINGE | INTRAVENOUS | Status: AC
  Filled 2020-05-01: qty 10

## 2020-05-01 MED ORDER — ETOMIDATE 2 MG/ML IV SOLN
INTRAVENOUS | Status: AC
  Filled 2020-05-01: qty 20

## 2020-05-01 MED ORDER — SODIUM CHLORIDE 0.9 % IV SOLN
100.0000 mg | INTRAVENOUS | Status: AC
  Filled 2020-05-01: qty 20

## 2020-05-01 MED ORDER — CHLORHEXIDINE GLUCONATE CLOTH 2 % EX PADS
6.0000 | MEDICATED_PAD | Freq: Every day | CUTANEOUS | Status: DC
  Administered 2020-05-01: 6 via TOPICAL

## 2020-05-01 MED ORDER — FENTANYL 2500MCG IN NS 250ML (10MCG/ML) PREMIX INFUSION
50.0000 ug/h | INTRAVENOUS | Status: DC
  Administered 2020-05-01: 50 ug/h via INTRAVENOUS
  Administered 2020-05-02: 75 ug/h via INTRAVENOUS
  Administered 2020-05-03: 225 ug/h via INTRAVENOUS
  Administered 2020-05-04: 300 ug/h via INTRAVENOUS
  Filled 2020-05-01 (×3): qty 250

## 2020-05-01 MED ORDER — VITAL HIGH PROTEIN PO LIQD: 1000.0000 mL | ORAL | Status: DC

## 2020-05-01 MED ORDER — TOCILIZUMAB 400 MG/20ML IV SOLN
800.0000 mg | Freq: Once | INTRAVENOUS | Status: AC
  Administered 2020-05-01: 800 mg via INTRAVENOUS
  Filled 2020-05-01: qty 40

## 2020-05-01 MED ORDER — FENTANYL CITRATE (PF) 100 MCG/2ML IJ SOLN
50.0000 ug | Freq: Once | INTRAMUSCULAR | Status: AC
  Administered 2020-05-01: 50 ug via INTRAVENOUS

## 2020-05-01 MED ORDER — ORAL CARE MOUTH RINSE
15.0000 mL | OROMUCOSAL | Status: DC
  Administered 2020-05-01 – 2020-05-21 (×191): 15 mL via OROMUCOSAL

## 2020-05-01 MED ORDER — MIDAZOLAM BOLUS VIA INFUSION
1.0000 mg | INTRAVENOUS | Status: DC | PRN
  Administered 2020-05-01: 1 mg via INTRAVENOUS
  Administered 2020-05-03 – 2020-05-08 (×6): 2 mg via INTRAVENOUS
  Administered 2020-05-08: 1 mg via INTRAVENOUS
  Administered 2020-05-09: 2 mg via INTRAVENOUS
  Administered 2020-05-10: 1 mg via INTRAVENOUS
  Filled 2020-05-01: qty 2

## 2020-05-01 MED ORDER — SODIUM CHLORIDE 0.9 % IV SOLN: 100.0000 mg | Freq: Every day | INTRAVENOUS | Status: DC

## 2020-05-01 MED ORDER — METHYLPREDNISOLONE SODIUM SUCC 125 MG IJ SOLR
60.0000 mg | Freq: Four times a day (QID) | INTRAMUSCULAR | Status: DC
  Administered 2020-05-01 – 2020-05-02 (×2): 60 mg via INTRAVENOUS
  Filled 2020-05-01: qty 2

## 2020-05-01 MED ORDER — INSULIN ASPART 100 UNIT/ML ~~LOC~~ SOLN
0.0000 [IU] | SUBCUTANEOUS | Status: DC
  Administered 2020-05-01: 4 [IU] via SUBCUTANEOUS
  Administered 2020-05-02: 7 [IU] via SUBCUTANEOUS
  Administered 2020-05-02: 15 [IU] via SUBCUTANEOUS
  Administered 2020-05-02 (×2): 7 [IU] via SUBCUTANEOUS
  Administered 2020-05-02: 4 [IU] via SUBCUTANEOUS
  Administered 2020-05-03: 7 [IU] via SUBCUTANEOUS
  Administered 2020-05-03 (×2): 4 [IU] via SUBCUTANEOUS
  Administered 2020-05-03: 7 [IU] via SUBCUTANEOUS
  Administered 2020-05-03: 11 [IU] via SUBCUTANEOUS
  Administered 2020-05-03 – 2020-05-04 (×2): 7 [IU] via SUBCUTANEOUS
  Administered 2020-05-04: 4 [IU] via SUBCUTANEOUS
  Administered 2020-05-04 (×4): 7 [IU] via SUBCUTANEOUS
  Administered 2020-05-05 (×2): 4 [IU] via SUBCUTANEOUS
  Administered 2020-05-05: 7 [IU] via SUBCUTANEOUS
  Administered 2020-05-05 (×2): 4 [IU] via SUBCUTANEOUS
  Administered 2020-05-05: 7 [IU] via SUBCUTANEOUS
  Administered 2020-05-05: 4 [IU] via SUBCUTANEOUS
  Administered 2020-05-06: 3 [IU] via SUBCUTANEOUS
  Administered 2020-05-06 (×2): 7 [IU] via SUBCUTANEOUS
  Administered 2020-05-06 (×2): 4 [IU] via SUBCUTANEOUS
  Administered 2020-05-07: 7 [IU] via SUBCUTANEOUS
  Administered 2020-05-07: 4 [IU] via SUBCUTANEOUS
  Administered 2020-05-07: 7 [IU] via SUBCUTANEOUS
  Administered 2020-05-07 (×2): 4 [IU] via SUBCUTANEOUS
  Administered 2020-05-07: 7 [IU] via SUBCUTANEOUS
  Administered 2020-05-08: 3 [IU] via SUBCUTANEOUS
  Administered 2020-05-08: 4 [IU] via SUBCUTANEOUS
  Administered 2020-05-08 (×2): 3 [IU] via SUBCUTANEOUS
  Administered 2020-05-08: 4 [IU] via SUBCUTANEOUS
  Administered 2020-05-08: 7 [IU] via SUBCUTANEOUS
  Administered 2020-05-09 (×2): 4 [IU] via SUBCUTANEOUS
  Administered 2020-05-09: 3 [IU] via SUBCUTANEOUS
  Administered 2020-05-09 – 2020-05-10 (×9): 4 [IU] via SUBCUTANEOUS
  Administered 2020-05-11: 3 [IU] via SUBCUTANEOUS
  Administered 2020-05-11: 4 [IU] via SUBCUTANEOUS
  Administered 2020-05-11: 7 [IU] via SUBCUTANEOUS
  Administered 2020-05-11: 4 [IU] via SUBCUTANEOUS
  Administered 2020-05-12 (×2): 3 [IU] via SUBCUTANEOUS
  Administered 2020-05-12: 4 [IU] via SUBCUTANEOUS
  Administered 2020-05-12: 7 [IU] via SUBCUTANEOUS
  Administered 2020-05-13 – 2020-05-14 (×7): 3 [IU] via SUBCUTANEOUS
  Administered 2020-05-14: 4 [IU] via SUBCUTANEOUS
  Administered 2020-05-14: 3 [IU] via SUBCUTANEOUS
  Administered 2020-05-15 (×4): 4 [IU] via SUBCUTANEOUS
  Administered 2020-05-15 (×2): 3 [IU] via SUBCUTANEOUS
  Administered 2020-05-15: 4 [IU] via SUBCUTANEOUS
  Administered 2020-05-16: 3 [IU] via SUBCUTANEOUS
  Administered 2020-05-16 – 2020-05-17 (×4): 4 [IU] via SUBCUTANEOUS
  Administered 2020-05-17: 7 [IU] via SUBCUTANEOUS
  Administered 2020-05-17 – 2020-05-18 (×4): 4 [IU] via SUBCUTANEOUS
  Administered 2020-05-18: 3 [IU] via SUBCUTANEOUS
  Administered 2020-05-18: 4 [IU] via SUBCUTANEOUS
  Administered 2020-05-18: 3 [IU] via SUBCUTANEOUS
  Administered 2020-05-18: 4 [IU] via SUBCUTANEOUS
  Administered 2020-05-19 (×2): 3 [IU] via SUBCUTANEOUS
  Administered 2020-05-20 (×4): 7 [IU] via SUBCUTANEOUS
  Administered 2020-05-20: 3 [IU] via SUBCUTANEOUS
  Administered 2020-05-20: 4 [IU] via SUBCUTANEOUS
  Administered 2020-05-21 (×2): 7 [IU] via SUBCUTANEOUS
  Filled 2020-05-01: qty 0.2

## 2020-05-01 MED ORDER — PROSOURCE TF PO LIQD
45.0000 mL | Freq: Two times a day (BID) | ORAL | Status: DC
  Administered 2020-05-01: 45 mL
  Filled 2020-05-01 (×2): qty 45

## 2020-05-01 MED ORDER — SUCCINYLCHOLINE CHLORIDE 200 MG/10ML IV SOSY
PREFILLED_SYRINGE | INTRAVENOUS | Status: AC
  Filled 2020-05-01: qty 10

## 2020-05-01 MED ORDER — MIDAZOLAM 50MG/50ML (1MG/ML) PREMIX INFUSION
2.0000 mg/h | INTRAVENOUS | Status: DC
  Administered 2020-05-01 – 2020-05-03 (×3): 2 mg/h via INTRAVENOUS
  Administered 2020-05-04: 5 mg/h via INTRAVENOUS
  Administered 2020-05-04: 9 mg/h via INTRAVENOUS
  Administered 2020-05-04 – 2020-05-05 (×5): 10 mg/h via INTRAVENOUS
  Administered 2020-05-05: 9 mg/h via INTRAVENOUS
  Administered 2020-05-05 – 2020-05-06 (×2): 10 mg/h via INTRAVENOUS
  Administered 2020-05-06: 9 mg/h via INTRAVENOUS
  Administered 2020-05-06 (×2): 10 mg/h via INTRAVENOUS
  Administered 2020-05-06: 9 mg/h via INTRAVENOUS
  Administered 2020-05-07 (×2): 10 mg/h via INTRAVENOUS
  Administered 2020-05-07: 9 mg/h via INTRAVENOUS
  Administered 2020-05-07: 10 mg/h via INTRAVENOUS
  Administered 2020-05-07: 9 mg/h via INTRAVENOUS
  Administered 2020-05-08: 10 mg/h via INTRAVENOUS
  Administered 2020-05-08: 9 mg/h via INTRAVENOUS
  Administered 2020-05-08: 10 mg/h via INTRAVENOUS
  Administered 2020-05-08: 9 mg/h via INTRAVENOUS
  Administered 2020-05-09 (×2): 8 mg/h via INTRAVENOUS
  Administered 2020-05-09: 9 mg/h via INTRAVENOUS
  Administered 2020-05-10: 8 mg/h via INTRAVENOUS
  Filled 2020-05-01 (×2): qty 50
  Filled 2020-05-01: qty 100
  Filled 2020-05-01 (×27): qty 50

## 2020-05-01 MED ORDER — PANTOPRAZOLE SODIUM 40 MG IV SOLR
40.0000 mg | Freq: Every day | INTRAVENOUS | Status: DC
  Administered 2020-05-01 – 2020-05-10 (×10): 40 mg via INTRAVENOUS
  Filled 2020-05-01 (×9): qty 40

## 2020-05-01 MED ORDER — ROCURONIUM BROMIDE 50 MG/5ML IV SOLN
INTRAVENOUS | Status: AC | PRN
  Administered 2020-05-01: 100 mg via INTRAVENOUS

## 2020-05-01 MED ORDER — SODIUM CHLORIDE 0.9 % IV SOLN: 200.0000 mg | Freq: Once | INTRAVENOUS | Status: DC

## 2020-05-01 MED ORDER — CHLORHEXIDINE GLUCONATE 0.12% ORAL RINSE (MEDLINE KIT)
15.0000 mL | Freq: Two times a day (BID) | OROMUCOSAL | Status: DC
  Administered 2020-05-02 – 2020-05-20 (×36): 15 mL via OROMUCOSAL

## 2020-05-01 MED ORDER — FENTANYL 2500MCG IN NS 250ML (10MCG/ML) PREMIX INFUSION
25.0000 ug/h | INTRAVENOUS | Status: DC
  Administered 2020-05-01: 25 ug/h via INTRAVENOUS
  Filled 2020-05-01: qty 250

## 2020-05-01 MED ORDER — ENOXAPARIN SODIUM 40 MG/0.4ML ~~LOC~~ SOLN
40.0000 mg | SUBCUTANEOUS | Status: DC
  Administered 2020-05-01: 40 mg via SUBCUTANEOUS
  Filled 2020-05-01: qty 0.4

## 2020-05-01 MED ORDER — VECURONIUM BOLUS VIA INFUSION
10.0000 mg | Freq: Once | INTRAVENOUS | Status: AC
  Administered 2020-05-01: 10 mg via INTRAVENOUS
  Filled 2020-05-01: qty 10

## 2020-05-01 MED ORDER — ORAL CARE MOUTH RINSE
15.0000 mL | OROMUCOSAL | Status: DC
  Administered 2020-05-01: 15 mL via OROMUCOSAL

## 2020-05-01 MED ORDER — FENTANYL BOLUS VIA INFUSION
50.0000 ug | INTRAVENOUS | Status: DC | PRN
  Administered 2020-05-01 – 2020-05-04 (×10): 50 ug via INTRAVENOUS
  Filled 2020-05-01: qty 50

## 2020-05-01 MED ORDER — PROPOFOL 500 MG/50ML IV EMUL
INTRAVENOUS | Status: AC | PRN
  Administered 2020-05-01: 5 ug via INTRAVENOUS

## 2020-05-01 MED ORDER — CHLORHEXIDINE GLUCONATE 0.12% ORAL RINSE (MEDLINE KIT)
15.0000 mL | Freq: Two times a day (BID) | OROMUCOSAL | Status: DC
  Administered 2020-05-01: 15 mL via OROMUCOSAL

## 2020-05-01 MED ORDER — PROPOFOL 500 MG/50ML IV EMUL
INTRAVENOUS | Status: AC
  Filled 2020-05-01: qty 50

## 2020-05-01 MED ORDER — ARTIFICIAL TEARS OPHTHALMIC OINT
1.0000 "application " | TOPICAL_OINTMENT | Freq: Three times a day (TID) | OPHTHALMIC | Status: DC
  Administered 2020-05-01 – 2020-05-04 (×9): 1 via OPHTHALMIC
  Filled 2020-05-01: qty 3.5

## 2020-05-01 MED ORDER — VECURONIUM BROMIDE 10 MG IV SOLR
0.0000 ug/kg/min | INTRAVENOUS | Status: DC
  Administered 2020-05-01: 1 ug/kg/min via INTRAVENOUS
  Administered 2020-05-03: 0.3 ug/kg/min via INTRAVENOUS
  Administered 2020-05-04: 1 ug/kg/min via INTRAVENOUS
  Filled 2020-05-01 (×2): qty 100

## 2020-05-01 MED ORDER — FENTANYL CITRATE (PF) 100 MCG/2ML IJ SOLN
INTRAMUSCULAR | Status: AC
  Filled 2020-05-01: qty 2

## 2020-05-01 MED ORDER — ETOMIDATE 2 MG/ML IV SOLN
INTRAVENOUS | Status: AC | PRN
  Administered 2020-05-01: 24 mg via INTRAVENOUS

## 2020-05-01 NOTE — ED Triage Notes (Signed)
Patient reports to the ER for being COVID positive. Patient reports she tested positive for COVID since Wednesday and became more SOB today. Patient was 43% on RA. Patient immediately brought back to a room and MD made aware. Patient pale and having trouble forming complete sentences.

## 2020-05-01 NOTE — Progress Notes (Signed)
Brief Nutrition Note  Consult received for enteral/tube feeding initiation and management.  Adult Enteral Nutrition Protocol initiated. Full assessment to follow.  Admitting Dx: Shortness of breath [R06.02] Encounter for imaging study to confirm orogastric (OG) tube placement [Z01.89] Acute respiratory failure with hypoxia (HCC) [J96.01] COVID-19 virus infection [U07.1] Acute respiratory distress syndrome (ARDS) due to COVID-19 virus (HCC) [U07.1, J80]  Body mass index is 38.49 kg/m. Pt meets criteria for obesity based on current BMI.  Labs:  Recent Labs  Lab 05-22-2020 1610  NA 135  K 3.9  CL 100  CO2 15*  BUN 27*  CREATININE 1.14*  CALCIUM 9.4  GLUCOSE 224*    Dawne Casali, MS, RD, LDN RD pager number and weekend/on-call pager number located in [place].

## 2020-05-01 NOTE — Procedures (Signed)
Central Venous Catheter Insertion Procedure Note  SHRITHA BRESEE  [id number]  [birth year]  Date:May 10, 2020  Time:6:38 PM   Provider Performing:Gerene Nedd   Procedure: Insertion of Non-tunneled Central Venous Catheter(36556) with US guidance (94076)   Indication(s) Medication administration and Difficult access  Consent Unable to obtain consent due to emergent nature of procedure.  Anesthesia Topical only with 1% lidocaine   Timeout Verified patient identification, verified procedure, site/side was marked, verified correct patient position, special equipment/implants available, medications/allergies/relevant history reviewed, required imaging and test results available.  Sterile Technique Maximal sterile technique including full sterile barrier drape, hand hygiene, sterile gown, sterile gloves, mask, hair covering, sterile ultrasound probe cover (if used).  Procedure Description Area of catheter insertion was cleaned with chlorhexidine and draped in sterile fashion.  With real-time ultrasound guidance a central venous catheter was placed into the left internal jugular vein. Nonpulsatile blood flow and easy flushing noted in all ports.  The catheter was sutured in place and sterile dressing applied.  Complications/Tolerance None; patient tolerated the procedure well. Chest X-ray is ordered to verify placement for internal jugular or subclavian cannulation.   Chest x-ray is not ordered for femoral cannulation.  EBL Minimal  Specimen(s) None  Grayden Burley MD [facility] Please see [email or web address] for pager details.  05/10/2020, 6:39 PM

## 2020-05-01 NOTE — ED Provider Notes (Signed)
[name] Provider Note   CSN: [id number] Arrival date & time: 04/30/2020  1522     History Chief Complaint  Patient presents with  . COVID Positive  . Respiratory Distress    Donna Campbell is a 59 y.o. female.  HPI   59yF with respiratory distress. History primarily from records. COVID+. Began having symptoms of fever and n/v/d on 04/23/20. Diagnosed with COVID on 04/26/20 at urgent care. Progressively worsening symptoms and significantly worse today. Severe dyspnea to point of not being able to speak intially. Initial o2 sats in 40s on RA. Only improving to 80s on NRB only able to speak 1-2 words at a time.    Past Medical History:  Diagnosis Date  . Allergy   . Asthma   . GERD (gastroesophageal reflux disease)   . Shortness of breath    sometimes on exertion    Patient Active Problem List   Diagnosis Date Noted  . 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 ABDOMINAL PAIN-GENERALIZED 03/10/2008  . ABNORMAL FINDINGS GI TRACT 03/10/2008  . NONSPEC ABN FIND-RADIOLOGY-OTH EXAM OF ABD AREA 03/10/2008    Past Surgical History:  Procedure Laterality Date  . CHOLECYSTECTOMY    . COLONOSCOPY    . SALPINGOOPHORECTOMY  07/30/2012   Procedure: SALPINGO OOPHERECTOMY;  Surgeon: Dorien Chihuahua. Richardson Dopp, MD;  Location: [facility];  Service: Gynecology;  Laterality: Left;  . TONSILLECTOMY    . VAGINAL HYSTERECTOMY  07/30/2012   Procedure: HYSTERECTOMY VAGINAL;  Surgeon: Dorien Chihuahua. Richardson Dopp, MD;  Location: [facility];  Service: Gynecology;  Laterality: N/A;  . VARICOSE VEIN SURGERY       OB History   No obstetric history on file.     No family history on file.  Social History   Tobacco Use  . Smoking status: Never Smoker  Substance Use Topics  . Alcohol use: No  . Drug use: No    Home Medications Prior to Admission medications   Medication Sig Start Date End Date Taking? Authorizing Provider  albuterol (PROVENTIL HFA;VENTOLIN  HFA) 108 (90 BASE) MCG/ACT inhaler Inhale 2 puffs into the lungs every 4 (four) hours as needed. For shortness of breath    [provider]  calcium carbonate (OS-CAL) 600 MG TABS Take 600 mg by mouth daily.    [provider]  cetirizine (ZYRTEC) 10 MG tablet Take 10 mg by mouth at bedtime.    [provider]  cholecalciferol (VITAMIN D) 1000 UNITS tablet Take 1,000 Units by mouth daily.    [provider]  Coenzyme Q-10 100 MG capsule Take 100 mg by mouth daily.    [provider]  Cranberry 400 MG CAPS Take 2,400 mg by mouth daily.    [provider]  ibuprofen (ADVIL,MOTRIN) 600 MG tablet Take 1 tablet (600 mg total) by mouth every 6 (six) hours as needed. Patient not taking: Reported on 02/15/2015 07/31/12   Geryl Rankins, MD  ipratropium (ATROVENT) 0.03 % nasal spray Place 2 sprays into both nostrils 2 (two) times daily. 02/15/15   Trena Platt D, PA  Multiple Vitamin (MULTIVITAMIN WITH MINERALS) TABS Take 1 tablet by mouth daily.    [provider]  nitrofurantoin (MACRODANTIN) 100 MG capsule Take 100 mg by mouth 4 (four) times daily.    [provider]  oxyCODONE-acetaminophen (PERCOCET/ROXICET) 5-325 MG per tablet Take 1-2 tablets by mouth every 4 (four) hours as needed (moderate to severe pain (when tolerating fluids)). Patient not taking:  Reported on 02/15/2015 07/31/12   Geryl Rankins, MD  simvastatin (ZOCOR) 40 MG tablet Take 40 mg by mouth every evening.    [provider]  vitamin B-12 (CYANOCOBALAMIN) 1000 MCG tablet Take 1,000 mcg by mouth daily.    [provider]    Allergies    Naproxen and Sulfonamide derivatives  Review of Systems   Review of Systems Level 5 caveat because of respiratory distress.  Physical Exam Updated Vital Signs Ht (P) 5\' 4"  (1.626 m)   LMP 07/17/2012   BMI (P) 39.17 kg/m   Physical Exam Vitals and nursing note reviewed.  Constitutional:       General: She is in acute distress.     Appearance: She is well-developed. She is obese. She is ill-appearing.  HENT:     Head: Normocephalic and atraumatic.  Eyes:     General:        Right eye: No discharge.        Left eye: No discharge.     Conjunctiva/sclera: Conjunctivae normal.  Cardiovascular:     Rate and Rhythm: Regular rhythm. Tachycardia present.     Heart sounds: Normal heart sounds. No murmur heard.  No friction rub. No gallop.   Pulmonary:     Effort: Respiratory distress present.     Comments: Breath sounds clear. Tachypnea.  Abdominal:     General: There is no distension.     Palpations: Abdomen is soft.     Tenderness: There is no abdominal tenderness.  Musculoskeletal:        General: No tenderness.     Cervical back: Neck supple.  Skin:    General: Skin is warm and dry.  Neurological:     Mental Status: She is alert.     Motor: No weakness.     ED Results / Procedures / Treatments   Labs (all labs ordered are listed, but only abnormal results are displayed) Labs Reviewed  D-DIMER, QUANTITATIVE (NOT AT [facility]) - Abnormal; Notable for the following components:      Result Value   D-Dimer, Quant 2.73 (*)    All other components within normal limits  FIBRINOGEN - Abnormal; Notable for the following components:   Fibrinogen 712 (*)    All other components within normal limits  BLOOD GAS, ARTERIAL - Abnormal; Notable for the following components:   pH, Arterial 7.165 (*)    pCO2 arterial 57.7 (*)    pO2, Arterial 58.8 (*)    Acid-base deficit 9.1 (*)    All other components within normal limits  CULTURE, BLOOD (ROUTINE X 2)  CULTURE, BLOOD (ROUTINE X 2)  CBC WITH DIFFERENTIAL/PLATELET  HIV ANTIBODY (ROUTINE TESTING W REFLEX)  BRAIN NATRIURETIC PEPTIDE  C-REACTIVE PROTEIN  COMPREHENSIVE METABOLIC PANEL  FERRITIN  HEPATITIS B SURFACE ANTIGEN  LACTATE DEHYDROGENASE  PROCALCITONIN  CREATININE, SERUM  CBC WITH DIFFERENTIAL/PLATELET  COMPREHENSIVE  METABOLIC PANEL  C-REACTIVE PROTEIN  D-DIMER, QUANTITATIVE (NOT AT [facility])  FERRITIN  MAGNESIUM  PHOSPHORUS  BLOOD GAS, ARTERIAL  ABO/RH  TYPE AND SCREEN  ABO/RH  TROPONIN I (HIGH SENSITIVITY)  TROPONIN I (HIGH SENSITIVITY)    EKG None  Radiology DG Abd 1 View  Result Date: 05/02/2020 CLINICAL DATA:  Orogastric tube placement. EXAM: ABDOMEN - 1 VIEW COMPARISON:  2020/05/11. FINDINGS: Distal tip of enteric tube is seen in the proximal stomach. No abnormal bowel dilatation is noted. IMPRESSION: Distal tip of enteric tube is seen in the proximal stomach. Electronically Signed   By:  Lupita Raider M.D.   On: 05/02/2020 14:40   DG CHEST PORT 1 VIEW  Result Date: 05/22/20 CLINICAL DATA:  59 year old female with acute respiratory distress. Central line placement. EXAM: PORTABLE CHEST 1 VIEW COMPARISON:  Earlier radiograph dated 22-May-2020. FINDINGS: Interval placement of a left IJ central venous line with tip over central SVC. Endotracheal tube remains above the carina and enteric tube extends below the diaphragm with tip beyond the inferior margin of the image. Diffuse bilateral airspace opacities as seen previously. No pleural effusion or pneumothorax. The cardiac silhouette is within limits. No acute osseous pathology. IMPRESSION: 1. Interval placement of a left IJ central venous line with tip over central SVC. No pneumothorax. 2. Bilateral pulmonary opacities. Electronically Signed   By: Elgie Collard M.D.   On: 05/22/20 19:24   Portable chest 1 View  Result Date: 05/22/2020 CLINICAL DATA:  59 year old female status post NG placement. EXAM: PORTABLE CHEST 1 VIEW COMPARISON:  Chest radiograph dated 06/19/2012. FINDINGS: Endotracheal tube with tip approximately 3 cm above the carina. Enteric tube extends below the diaphragm with tip and side-port in the left upper abdomen likely in the stomach. Bilateral confluent airspace opacities most consistent with pneumonia. Clinical correlation  and follow-up to resolution recommended. No large pleural effusion or pneumothorax. The cardiac silhouette is within limits. No acute osseous pathology. IMPRESSION: 1. Endotracheal tube above the carina. Enteric tube in the stomach. 2. Bilateral confluent airspace opacities most consistent with pneumonia. Electronically Signed   By: Elgie Collard M.D.   On: 05/22/20 16:57   DG Abd Portable 1V  Result Date: 05/22/20 CLINICAL DATA:  59 year old female status post NG placement. EXAM: PORTABLE CHEST 1 VIEW COMPARISON:  Chest radiograph dated 06/19/2012. FINDINGS: Endotracheal tube with tip approximately 3 cm above the carina. Enteric tube extends below the diaphragm with tip and side-port in the left upper abdomen likely in the stomach. Bilateral confluent airspace opacities most consistent with pneumonia. Clinical correlation and follow-up to resolution recommended. No large pleural effusion or pneumothorax. The cardiac silhouette is within limits. No acute osseous pathology. IMPRESSION: 1. Endotracheal tube above the carina. Enteric tube in the stomach. 2. Bilateral confluent airspace opacities most consistent with pneumonia. Electronically Signed   By: Elgie Collard M.D.   On: 05/22/20 16:57    Procedures OG placement  Date/Time: 05/22/20 4:00 PM Performed by: Raeford Razor, MD Authorized by: Raeford Razor, MD  Local anesthesia used: no  Anesthesia: Local anesthesia used: no  Sedation: Patient sedated: yes Sedatives: etomidate Vitals: Vital signs were monitored during sedation.  Patient tolerance: patient tolerated the procedure well with no immediate complications Comments: OG placed after airway secured.     INTUBATION Performed by: Raeford Razor  Required items: required blood products, implants, devices, and special equipment available Patient identity confirmed: provided demographic data and hospital-assigned identification number Time out: Immediately prior to  procedure a "time out" was called to verify the correct patient, procedure, equipment, support staff and site/side marked as required.  Indications: respiratory failure  Intubation method: Glidescope Laryngoscopy   Preoxygenation: NRB  Sedatives: Etomidate Paralytic: Rocuronium  Tube Size: 7.5 cuffed  Post-procedure assessment: chest rise and ETCO2 monitor Breath sounds: equal and absent over the epigastrium Tube secured with: ETT holder Chest x-ray interpreted by radiologist and me.  Chest x-ray findings: endotracheal tube in appropriate position  Patient tolerated the procedure well with no immediate complications.  CRITICAL CARE Performed by: Raeford Razor Total critical care time: 45 minutes Critical care time was  exclusive of separately billable procedures and treating other patients. Critical care was necessary to treat or prevent imminent or life-threatening deterioration. Critical care was time spent personally by me on the following activities: development of treatment plan with patient and/or surrogate as well as nursing, discussions with consultants, evaluation of patient's response to treatment, examination of patient, obtaining history from patient or surrogate, ordering and performing treatments and interventions, ordering and review of laboratory studies, ordering and review of radiographic studies, pulse oximetry and re-evaluation of patient's condition.    Medications Ordered in ED Medications  succinylcholine (ANECTINE) 200 MG/10ML syringe (  Not Given 2020/04/15 1631)  rocuronium bromide 100 MG/10ML SOSY (has no administration in time range)  fentaNYL 2500mcg in NS 250mL (2710mcg/ml) infusion-PREMIX (has no administration in time range)  fentaNYL (SUBLIMAZE) injection 50 mcg (50 mcg Intravenous Given 2020/04/15 1647)  etomidate (AMIDATE) injection (24 mg Intravenous Given 2020/04/15 1553)  rocuronium (ZEMURON) injection (100 mg Intravenous Given 2020/04/15 1554)  propofol  (DIPRIVAN) 500 MG/50ML infusion (5 mcg Intravenous New Bag/Given 2020/04/15 1600)    ED Course  I have reviewed the triage vital signs and the nursing notes.  Pertinent labs & imaging results that were available during my care of the patient were reviewed by me and considered in my medical decision making (see chart for details).    MDM Rules/Calculators/A&P                          59yF with respiratory failure. Presumed from COVID. Remained hypoxic and with significant work of breathing despite NRB. Intubated. Discussed with CCM. Admit.   Donna Campbell was evaluated in Emergency Department on 09-05-20 for the symptoms described in the history of present illness. She was evaluated in the context of the global COVID-19 pandemic, which necessitated consideration that the patient might be at risk for infection with the SARS-CoV-2 virus that causes COVID-19. Institutional protocols and algorithms that pertain to the evaluation of patients at risk for COVID-19 are in a state of rapid change based on information released by regulatory bodies including the CDC and federal and state organizations. These policies and algorithms were followed during the patient's care in the ED.   Final Clinical Impression(s) / ED Diagnoses Final diagnoses:  Shortness of breath  Acute respiratory failure with hypoxia (HCC)  COVID-19 virus infection    Rx / DC Orders ED Discharge Orders    None       Raeford RazorKohut, Sevastian Witczak, MD 05/02/20 [phone]

## 2020-05-01 NOTE — ED Notes (Addendum)
Staff present for intubation: Kohut MD, this RN, Jester Resp therapist  20G RAC  Sedation start 1552  24mg  Etomidate 1553 100mg  Roc 1554  Intubation with color change ETT 7.5 1556 24@lip   OG tube placed 16Fr 1600  Propofol 1600

## 2020-05-01 NOTE — H&P (Signed)
NAME:  Donna Campbell, MRN:  [id number], DOB:  Oct 10, 1960, LOS: 0 ADMISSION DATE:  05-16-2020, CONSULTATION DATE:  05-16-20 REFERRING MD:  EDP, CHIEF COMPLAINT: ARDS, COVID-19 pneumonia  Brief History   59 year old unvaccinated patient with history of asthma, obesity Diagnosed with COVID-19 on 5/21.  Admitted with progressive hypoxia.  Intubated in the ED with sats in the 30s.  PCCM called for admission  Past Medical History    has a past medical history of Allergy, Asthma, GERD (gastroesophageal reflux disease), and Shortness of breath.  Significant Hospital Events   7/25 Admit  Consults:  PCCM  Procedures:  ETT 7/25 Lt CVL 7/25  Significant Diagnostic Tests:  Chest x-ray 7/25-diffuse bilateral interstitial infiltrate  Micro Data:  Blood culture  Antimicrobials/COVID Rx  Remdesivir Actemra Solu-Medrol  Interim history/subjective:    Objective   Blood pressure (!) 188/110, pulse (!) 130, temperature (!) 101.9 F (38.8 C), resp. rate (!) 33, height 5\' 4"  (1.626 m), last menstrual period 07/17/2012, SpO2 (!) 80 %.    Vent Mode: PRVC FiO2 (%):  [100 %] 100 % Set Rate:  [26 bmp-35 bmp] 35 bmp Vt Set:  [330 mL] 330 mL PEEP:  [16 cmH20-18 cmH20] 18 cmH20  No intake or output data in the 24 hours ending May 16, 2020 1832 There were no vitals filed for this visit.  Examination: Gen:      No acute distress HEENT:  EOMI, sclera anicteric, ETT Neck:     No masses; no thyromegaly Lungs:    Clear to auscultation bilaterally; normal respiratory effort CV:         Regular rate and rhythm; no murmurs Abd:      + bowel sounds; soft, non-tender; no palpable masses, no distension Ext:    No edema; adequate peripheral perfusion Skin:      Warm and dry; no rash Neuro: Sedated, unresponsive  Resolved Hospital Problem list     Assessment & Plan:  Severe ARDS, COVID-19 pneumonia 6 cc/kg tidal volume ventilation Start paralysis, proning Remdesivir, Actemra, steroids.  Discussed  off label use of Actemra with husband and he agreed to treatment.  Hyperglycemia SSI coverage  Goals of care Husband requested full code  Best practice:  Diet: NPO Pain/Anxiety/Delirium protocol (if indicated): Fentanyl, Versed drip VAP protocol (if indicated): Ordered DVT prophylaxis: Lovenox GI prophylaxis: PPI Glucose control: SSI Mobility: Bed Code Status: Full Family Communication: Husband updated 7/25 Disposition: ICU  Labs   CBC: Recent Labs  Lab 2020/05/16 1610  WBC 9.7  NEUTROABS 6.8  HGB 13.7  HCT 41.4  MCV 89.8  PLT 323    Basic Metabolic Panel: Recent Labs  Lab 05-16-2020 1610  NA 135  K 3.9  CL 100  CO2 15*  GLUCOSE 224*  BUN 27*  CREATININE 1.14*  CALCIUM 9.4   GFR: CrCl cannot be calculated (Unknown ideal weight.). Recent Labs  Lab 2020/05/16 1610  PROCALCITON 0.14  WBC 9.7    Liver Function Tests: Recent Labs  Lab 05/16/20 1610  AST 89*  ALT 61*  ALKPHOS 76  BILITOT 0.5  PROT 8.2*  ALBUMIN 3.5   No results for input(s): LIPASE, AMYLASE in the last 168 hours. No results for input(s): AMMONIA in the last 168 hours.  ABG    Component Value Date/Time   PHART 7.165 (LL) 05/16/2020 1613   PCO2ART 57.7 (H) 05-16-2020 1613   PO2ART 58.8 (L) 05/16/2020 1613   HCO3 20.0 2020/05/16 1613   ACIDBASEDEF 9.1 (H) 2020-05-16 1613  O2SAT 76.3 2020-05-28 1613     Coagulation Profile: No results for input(s): INR, PROTIME in the last 168 hours.  Cardiac Enzymes: No results for input(s): CKTOTAL, CKMB, CKMBINDEX, TROPONINI in the last 168 hours.  HbA1C: No results found for: HGBA1C  CBG: No results for input(s): GLUCAP in the last 168 hours.  Review of Systems:   Unable to obtain due to altered mental status  Past Medical History  She,  has a past medical history of Allergy, Asthma, GERD (gastroesophageal reflux disease), and Shortness of breath.   Surgical History    Past Surgical History:  Procedure Laterality Date  .  CHOLECYSTECTOMY    . COLONOSCOPY    . SALPINGOOPHORECTOMY  07/30/2012   Procedure: SALPINGO OOPHERECTOMY;  Surgeon: Dorien Chihuahua. Richardson Dopp, MD;  Location: [facility];  Service: Gynecology;  Laterality: Left;  . TONSILLECTOMY    . VAGINAL HYSTERECTOMY  07/30/2012   Procedure: HYSTERECTOMY VAGINAL;  Surgeon: Dorien Chihuahua. Richardson Dopp, MD;  Location: [facility];  Service: Gynecology;  Laterality: N/A;  . VARICOSE VEIN SURGERY       Social History   reports that she has never smoked. She does not have any smokeless tobacco history on file. She reports that she does not drink alcohol and does not use drugs.   Family History   Her family history is not on file.   Allergies Allergies  Allergen Reactions  . Naproxen Hives    Pt says she can take ibuprofen  . Sulfonamide Derivatives Hives     Home Medications  Prior to Admission medications   Medication Sig Start Date End Date Taking? Authorizing Provider  albuterol (PROVENTIL HFA;VENTOLIN HFA) 108 (90 BASE) MCG/ACT inhaler Inhale 2 puffs into the lungs every 4 (four) hours as needed. For shortness of breath    [provider]  calcium carbonate (OS-CAL) 600 MG TABS Take 600 mg by mouth daily.    [provider]  cetirizine (ZYRTEC) 10 MG tablet Take 10 mg by mouth at bedtime.    [provider]  cholecalciferol (VITAMIN D) 1000 UNITS tablet Take 1,000 Units by mouth daily.    [provider]  Coenzyme Q-10 100 MG capsule Take 100 mg by mouth daily.    [provider]  Cranberry 400 MG CAPS Take 2,400 mg by mouth daily.    [provider]  ibuprofen (ADVIL,MOTRIN) 600 MG tablet Take 1 tablet (600 mg total) by mouth every 6 (six) hours as needed. Patient not taking: Reported on 02/15/2015 07/31/12   Geryl Rankins, MD  ipratropium (ATROVENT) 0.03 % nasal spray Place 2 sprays into both nostrils 2 (two) times daily. 02/15/15   Trena Platt D, PA  Multiple Vitamin (MULTIVITAMIN WITH MINERALS) TABS Take 1 tablet by  mouth daily.    [provider]  nitrofurantoin (MACRODANTIN) 100 MG capsule Take 100 mg by mouth 4 (four) times daily.    [provider]  oxyCODONE-acetaminophen (PERCOCET/ROXICET) 5-325 MG per tablet Take 1-2 tablets by mouth every 4 (four) hours as needed (moderate to severe pain (when tolerating fluids)). Patient not taking: Reported on 02/15/2015 07/31/12   Geryl Rankins, MD  simvastatin (ZOCOR) 40 MG tablet Take 40 mg by mouth every evening.    [provider]  vitamin B-12 (CYANOCOBALAMIN) 1000 MCG tablet Take 1,000 mcg by mouth daily.    [provider]     Critical care time:    The patient is critically ill with multiple organ system failure and requires high complexity  decision making for assessment and support, frequent evaluation and titration of therapies, advanced monitoring, review of radiographic studies and interpretation of complex data.   Critical Care Time devoted to patient care services, exclusive of separately billable procedures, described in this note is 35 minutes.   Chilton Greathouse MD [facility] Please see [email or web address] for pager details.  04/10/2020, 6:37 PM

## 2020-05-01 NOTE — Progress Notes (Signed)
Patient ordered repeat ABG after RR and PEEP change on vent. Patient is currently dyssynchronous with the vent and O2 sats 77-80%. Patient is to be proned and paralyzed. ABG to be held until that time per PCCM. Night RT to be made aware. Patient transported to ICU without complication at this time.

## 2020-05-02 ENCOUNTER — Inpatient Hospital Stay (HOSPITAL_COMMUNITY): Payer: BC Managed Care – PPO

## 2020-05-02 ENCOUNTER — Encounter (HOSPITAL_COMMUNITY): Payer: Self-pay | Admitting: Pulmonary Disease

## 2020-05-02 LAB — BASIC METABOLIC PANEL
Anion gap: 13 (ref 5–15)
Anion gap: 11 (ref 5–15)
BUN: 32 mg/dL — ABNORMAL HIGH (ref 6–20)
BUN: 34 mg/dL — ABNORMAL HIGH (ref 6–20)
CO2: 21 mmol/L — ABNORMAL LOW (ref 22–32)
CO2: 22 mmol/L (ref 22–32)
Calcium: 8.2 mg/dL — ABNORMAL LOW (ref 8.9–10.3)
Calcium: 7.8 mg/dL — ABNORMAL LOW (ref 8.9–10.3)
Chloride: 102 mmol/L (ref 98–111)
Chloride: 107 mmol/L (ref 98–111)
Creatinine, Ser: 1.32 mg/dL — ABNORMAL HIGH (ref 0.44–1.00)
Creatinine, Ser: 1.22 mg/dL — ABNORMAL HIGH (ref 0.44–1.00)
GFR calc Af Amer: 51 mL/min — ABNORMAL LOW (ref >60)
GFR calc Af Amer: 57 mL/min — ABNORMAL LOW (ref >60)
GFR calc non Af Amer: 44 mL/min — ABNORMAL LOW (ref >60)
GFR calc non Af Amer: 49 mL/min — ABNORMAL LOW (ref >60)
Glucose, Bld: 321 mg/dL — ABNORMAL HIGH (ref 70–99)
Glucose, Bld: 235 mg/dL — ABNORMAL HIGH (ref 70–99)
Potassium: 4.5 mmol/L (ref 3.5–5.1)
Potassium: 4.2 mmol/L (ref 3.5–5.1)
Sodium: 136 mmol/L (ref 135–145)
Sodium: 140 mmol/L (ref 135–145)

## 2020-05-02 LAB — COMPREHENSIVE METABOLIC PANEL
ALT: 69 U/L — ABNORMAL HIGH (ref 0–44)
AST: 102 U/L — ABNORMAL HIGH (ref 15–41)
Albumin: 3.3 g/dL — ABNORMAL LOW (ref 3.5–5.0)
Alkaline Phosphatase: 79 U/L (ref 38–126)
Anion gap: 14 (ref 5–15)
BUN: 32 mg/dL — ABNORMAL HIGH (ref 6–20)
CO2: 19 mmol/L — ABNORMAL LOW (ref 22–32)
Calcium: 8.4 mg/dL — ABNORMAL LOW (ref 8.9–10.3)
Chloride: 102 mmol/L (ref 98–111)
Creatinine, Ser: 1.39 mg/dL — ABNORMAL HIGH (ref 0.44–1.00)
GFR calc Af Amer: 48 mL/min — ABNORMAL LOW (ref >60)
GFR calc non Af Amer: 42 mL/min — ABNORMAL LOW (ref >60)
Glucose, Bld: 270 mg/dL — ABNORMAL HIGH (ref 70–99)
Potassium: 5.5 mmol/L — ABNORMAL HIGH (ref 3.5–5.1)
Sodium: 135 mmol/L (ref 135–145)
Total Bilirubin: 0.6 mg/dL (ref 0.3–1.2)
Total Protein: 7.3 g/dL (ref 6.5–8.1)

## 2020-05-02 LAB — TROPONIN I (HIGH SENSITIVITY)
Troponin I (High Sensitivity): 3131 ng/L (ref <18)
Troponin I (High Sensitivity): 2089 ng/L (ref <18)
Troponin I (High Sensitivity): 1615 ng/L (ref <18)

## 2020-05-02 LAB — FERRITIN: Ferritin: 2452 ng/mL — ABNORMAL HIGH (ref 11–307)

## 2020-05-02 LAB — GLUCOSE, CAPILLARY
Glucose-Capillary: 196 mg/dL — ABNORMAL HIGH (ref 70–99)
Glucose-Capillary: 228 mg/dL — ABNORMAL HIGH (ref 70–99)
Glucose-Capillary: 224 mg/dL — ABNORMAL HIGH (ref 70–99)
Glucose-Capillary: 321 mg/dL — ABNORMAL HIGH (ref 70–99)
Glucose-Capillary: 240 mg/dL — ABNORMAL HIGH (ref 70–99)
Glucose-Capillary: 166 mg/dL — ABNORMAL HIGH (ref 70–99)

## 2020-05-02 LAB — CBC WITH DIFFERENTIAL/PLATELET
Abs Immature Granulocytes: 0.06 10*3/uL (ref 0.00–0.07)
Basophils Absolute: 0 10*3/uL (ref 0.0–0.1)
Basophils Relative: 0 %
Eosinophils Absolute: 0 10*3/uL (ref 0.0–0.5)
Eosinophils Relative: 0 %
HCT: 40.1 % (ref 36.0–46.0)
Hemoglobin: 12.9 g/dL (ref 12.0–15.0)
Immature Granulocytes: 1 %
Lymphocytes Relative: 9 %
Lymphs Abs: 0.6 10*3/uL — ABNORMAL LOW (ref 0.7–4.0)
MCH: 29.5 pg (ref 26.0–34.0)
MCHC: 32.2 g/dL (ref 30.0–36.0)
MCV: 91.8 fL (ref 80.0–100.0)
Monocytes Absolute: 0.2 10*3/uL (ref 0.1–1.0)
Monocytes Relative: 3 %
Neutro Abs: 5.3 10*3/uL (ref 1.7–7.7)
Neutrophils Relative %: 87 %
Platelets: 272 10*3/uL (ref 150–400)
RBC: 4.37 MIL/uL (ref 3.87–5.11)
RDW: 14.1 % (ref 11.5–15.5)
WBC Morphology: INCREASED
WBC: 6.1 10*3/uL (ref 4.0–10.5)
nRBC: 0.3 % — ABNORMAL HIGH (ref 0.0–0.2)

## 2020-05-02 LAB — BLOOD GAS, ARTERIAL
Acid-base deficit: 6.4 mmol/L — ABNORMAL HIGH (ref 0.0–2.0)
Bicarbonate: 21.5 mmol/L (ref 20.0–28.0)
Drawn by: 29503
FIO2: 90
MECHVT: 330 mL
O2 Saturation: 98.4 %
PEEP: 18 cmH2O
Patient temperature: 98.6
RATE: 35 resp/min
pCO2 arterial: 55.4 mmHg — ABNORMAL HIGH (ref 32.0–48.0)
pH, Arterial: 7.214 — ABNORMAL LOW (ref 7.350–7.450)
pO2, Arterial: 166 mmHg — ABNORMAL HIGH (ref 83.0–108.0)

## 2020-05-02 LAB — C-REACTIVE PROTEIN: CRP: 14.8 mg/dL — ABNORMAL HIGH (ref <1.0)

## 2020-05-02 LAB — TYPE AND SCREEN
ABO/RH(D): O POS
Antibody Screen: NEGATIVE

## 2020-05-02 LAB — HEPARIN LEVEL (UNFRACTIONATED)
Heparin Unfractionated: 0.83 IU/mL — ABNORMAL HIGH (ref 0.30–0.70)
Heparin Unfractionated: 0.5 IU/mL (ref 0.30–0.70)

## 2020-05-02 LAB — D-DIMER, QUANTITATIVE: D-Dimer, Quant: 5.72 ug/mL-FEU — ABNORMAL HIGH (ref 0.00–0.50)

## 2020-05-02 LAB — MAGNESIUM: Magnesium: 2.4 mg/dL (ref 1.7–2.4)

## 2020-05-02 LAB — ABO/RH: ABO/RH(D): O POS

## 2020-05-02 LAB — PHOSPHORUS: Phosphorus: 6.6 mg/dL — ABNORMAL HIGH (ref 2.5–4.6)

## 2020-05-02 LAB — MRSA PCR SCREENING: MRSA by PCR: POSITIVE — AB

## 2020-05-02 MED ORDER — INSULIN DETEMIR 100 UNIT/ML ~~LOC~~ SOLN
5.0000 [IU] | Freq: Two times a day (BID) | SUBCUTANEOUS | Status: DC
  Administered 2020-05-02 (×2): 5 [IU] via SUBCUTANEOUS
  Filled 2020-05-02 (×3): qty 0.05

## 2020-05-02 MED ORDER — LACTATED RINGERS IV BOLUS
500.0000 mL | Freq: Once | INTRAVENOUS | Status: AC
  Administered 2020-05-02: 500 mL via INTRAVENOUS

## 2020-05-02 MED ORDER — TOCILIZUMAB 400 MG/20ML IV SOLN
800.0000 mg | Freq: Once | INTRAVENOUS | Status: AC
  Administered 2020-05-02: 800 mg via INTRAVENOUS
  Filled 2020-05-02: qty 40

## 2020-05-02 MED ORDER — SODIUM CHLORIDE 0.9 % IV SOLN
100.0000 mg | Freq: Every day | INTRAVENOUS | Status: AC
  Administered 2020-05-03 – 2020-05-06 (×4): 100 mg via INTRAVENOUS
  Filled 2020-05-02 (×4): qty 20

## 2020-05-02 MED ORDER — SODIUM CHLORIDE 0.9 % IV SOLN
100.0000 mg | INTRAVENOUS | Status: AC
  Administered 2020-05-02 (×2): 100 mg via INTRAVENOUS
  Filled 2020-05-02 (×2): qty 20

## 2020-05-02 MED ORDER — HEPARIN BOLUS VIA INFUSION
3500.0000 [IU] | Freq: Once | INTRAVENOUS | Status: AC
  Administered 2020-05-02: 3500 [IU] via INTRAVENOUS
  Filled 2020-05-02: qty 3500

## 2020-05-02 MED ORDER — INSULIN ASPART 100 UNIT/ML ~~LOC~~ SOLN
10.0000 [IU] | Freq: Once | SUBCUTANEOUS | Status: AC
  Administered 2020-05-02: 10 [IU] via SUBCUTANEOUS

## 2020-05-02 MED ORDER — METHYLPREDNISOLONE SODIUM SUCC 125 MG IJ SOLR
60.0000 mg | Freq: Four times a day (QID) | INTRAMUSCULAR | Status: DC
  Administered 2020-05-02 – 2020-05-03 (×5): 60 mg via INTRAVENOUS
  Filled 2020-05-02 (×2): qty 2

## 2020-05-02 MED ORDER — HEPARIN (PORCINE) 25000 UT/250ML-% IV SOLN
850.0000 [IU]/h | INTRAVENOUS | Status: DC
  Administered 2020-05-02: 1100 [IU]/h via INTRAVENOUS
  Administered 2020-05-03: 850 [IU]/h via INTRAVENOUS

## 2020-05-02 MED ORDER — MUPIROCIN 2 % EX OINT
1.0000 "application " | TOPICAL_OINTMENT | Freq: Two times a day (BID) | CUTANEOUS | Status: AC
  Administered 2020-05-02 – 2020-05-06 (×10): 1 via NASAL
  Filled 2020-05-02: qty 22

## 2020-05-02 MED ORDER — VITAL AF 1.2 CAL PO LIQD
1000.0000 mL | ORAL | Status: AC
  Administered 2020-05-02 – 2020-05-08 (×5): 1000 mL

## 2020-05-02 MED ORDER — METOPROLOL TARTRATE 5 MG/5ML IV SOLN
2.5000 mg | Freq: Four times a day (QID) | INTRAVENOUS | Status: DC
  Administered 2020-05-02 – 2020-05-11 (×37): 2.5 mg via INTRAVENOUS
  Filled 2020-05-02 (×35): qty 5

## 2020-05-02 MED ORDER — PROSOURCE TF PO LIQD
90.0000 mL | Freq: Two times a day (BID) | ORAL | Status: DC
  Administered 2020-05-02 – 2020-05-09 (×13): 90 mL
  Filled 2020-05-02 (×15): qty 90

## 2020-05-02 MED ORDER — CHLORHEXIDINE GLUCONATE CLOTH 2 % EX PADS
6.0000 | MEDICATED_PAD | Freq: Every day | CUTANEOUS | Status: AC
  Administered 2020-05-02 – 2020-05-05 (×4): 6 via TOPICAL

## 2020-05-02 MED ORDER — SODIUM CHLORIDE 0.45 % IV BOLUS
500.0000 mL | Freq: Once | INTRAVENOUS | Status: AC
  Administered 2020-05-02: 500 mL via INTRAVENOUS

## 2020-05-02 MED ORDER — SODIUM POLYSTYRENE SULFONATE 15 GM/60ML PO SUSP
30.0000 g | Freq: Once | ORAL | Status: AC
  Administered 2020-05-02: 30 g via RECTAL
  Filled 2020-05-02: qty 120

## 2020-05-02 MED ORDER — DEXTROSE 50 % IV SOLN
1.0000 | Freq: Once | INTRAVENOUS | Status: AC
  Administered 2020-05-02: 50 mL via INTRAVENOUS
  Filled 2020-05-02: qty 50

## 2020-05-02 NOTE — Progress Notes (Addendum)
NAME:  Donna Campbell, MRN:  [id number], DOB:  [birth year], LOS: 1 ADMISSION DATE:  22-May-2020, CONSULTATION DATE:  22-May-2020 REFERRING MD:  EDP, CHIEF COMPLAINT: ARDS, COVID-19 pneumonia  Brief History   59 year old unvaccinated patient with history of asthma, obesity Diagnosed with COVID-19 on 5/21.  Admitted with progressive hypoxia.  Intubated in the ED with sats in the 30s.  PCCM called for admission  Past Medical History    has a past medical history of Allergy, Asthma, GERD (gastroesophageal reflux disease), and Shortness of breath.  Significant Hospital Events   7/25 Admit  Consults:  PCCM  Procedures:  ETT 7/25 Lt CVL 7/25  Significant Diagnostic Tests:  Chest x-ray 7/25-diffuse bilateral interstitial infiltrate  Micro Data:  Blood culture  Antimicrobials/COVID Rx  Remdesivir Actemra  X 2 Solu-Medrol  Interim history/subjective:   Remains prone with improvement in sats Hemodynamically stable. Continues on dialysis.  Objective   Blood pressure (!) 140/90, pulse 100, temperature 98.2 F (36.8 C), resp. rate (!) 35, height 5\' 4"  (1.626 m), weight (!) 101.7 kg, last menstrual period 07/17/2012, SpO2 100 %.    Vent Mode: PRVC FiO2 (%):  [100 %] 100 % Set Rate:  [26 bmp-35 bmp] 35 bmp Vt Set:  [330 mL] 330 mL PEEP:  [16 cmH20-18 cmH20] 18 cmH20 Plateau Pressure:  [29 cmH20] 29 cmH20   Intake/Output Summary (Last 24 hours) at 05/02/2020 0934 Last data filed at 05/02/2020 0600 Gross per 24 hour  Intake 288.55 ml  Output 285 ml  Net 3.55 ml   Filed Weights   May 22, 2020 1800  Weight: (!) 101.7 kg    Examination: Gen:      No acute distress HEENT:  EOMI, sclera anicteric Neck:     No masses; no thyromegaly, ETT Lungs:    Clear to auscultation bilaterally; normal respiratory effort CV:         Regular rate and rhythm; no murmurs Abd:      + bowel sounds; soft, non-tender; no palpable masses, no distension Ext:    No edema; adequate peripheral  perfusion Skin:      Warm and dry; no rash Neuro: Sedated, unresponsive.  Labs reviewed Significant for potassium 5.5, glucose 270, BUN/creatinine 32/1.39, Phos 6.6, troponin 3131 No new imaging  Resolved Hospital Problem list     Assessment & Plan:  Severe ARDS, COVID-19 pneumonia 6 cc/kg tidal volume ventilation Continue paralysis Repeat ABG today.  Prone again if P/F less than 150 Goal driving pressure less than fifteen Remdesivir, Actemra, steroids.  Discussed off label use of Actemra with husband and he agreed to treatment. Repeat Actemra dose  Hyperglycemia SSI coverage Add levemir  AKI, hyperkalemia Insulin, Lasix x1 Follow labs  Elevated troponin Started on heparin drip overnight.  Check echocardiogram  Goals of care Husband requested full code  Best practice:  Diet: NPO Pain/Anxiety/Delirium protocol (if indicated): Fentanyl, Versed drip VAP protocol (if indicated): Ordered DVT prophylaxis: Lovenox GI prophylaxis: PPI Glucose control: SSI Mobility: Bed Code Status: Full Family Communication: Husband updated 7/25 and 7/26 Disposition: ICU  Critical care time:    The patient is critically ill with multiple organ system failure and requires high complexity decision making for assessment and support, frequent evaluation and titration of therapies, advanced monitoring, review of radiographic studies and interpretation of complex data.   Critical Care Time devoted to patient care services, exclusive of separately billable procedures, described in this note is 35 minutes.   8/26 MD [facility]  Please see [email or web address] for pager details.  05/02/2020, 9:34 AM

## 2020-05-02 NOTE — Progress Notes (Signed)
Elink informed of low urine output. CVP checked = 9. elink aware

## 2020-05-02 NOTE — Progress Notes (Signed)
eLink Physician-Brief Progress Note Patient Name: Donna Campbell DOB: 04/24/1961 MRN: [id number]   Date of Service  05/02/2020  HPI/Events of Note  Patient has foley catheter and no orders for Foley catheter.   eICU Interventions  Will order: 1. Continue Foley catheter.      Intervention Category Major Interventions: Other:  Zanden Colver Dennard Nip 05/02/2020, 2:19 AM

## 2020-05-02 NOTE — Progress Notes (Signed)
ANTICOAGULATION CONSULT NOTE - Initial Consult  Pharmacy Consult for Heparin Indication: chest pain/ACS  Allergies  Allergen Reactions  . Naproxen Hives    Pt says she can take ibuprofen  . Sulfonamide Derivatives Hives    Patient Measurements: Height: 5\' 4"  (162.6 cm) Weight: (!) 101.7 kg (224 lb 3.3 oz) IBW/kg (Calculated) : 54.7 HEPARIN DW (KG): 78.4  Vital Signs: Temp: 99.5 F (37.5 C) (07/26 0400) Temp Source: Bladder (07/26 0400) BP: 145/80 (07/26 0400) Pulse Rate: 105 (07/26 0400)  Labs: Recent Labs    05/14/2020 1610 05/02/20 0300  HGB 13.7 12.9  HCT 41.4 40.1  PLT 323 272  CREATININE 1.14* 1.39*  TROPONINIHS 67* 3,131*    Estimated Creatinine Clearance: 51.2 mL/min (A) (by C-G formula based on SCr of 1.39 mg/dL (H)).   Medical History: Past Medical History:  Diagnosis Date  . Allergy   . Asthma   . GERD (gastroesophageal reflux disease)   . Shortness of breath    sometimes on exertion    Medications:  Infusions:  . fentaNYL infusion INTRAVENOUS 75 mcg/hr (14-May-2020 2325)  . midazolam 2 mg/hr (05/02/20 0353)  . remdesivir 100 mg in NS 100 mL    . sodium chloride    . vecuronium (NORCURON) infusion 0.5 mcg/kg/min (05/02/20 0353)    Assessment: 59 yo COVID+ F. Troponin elevated; DDimer increasing.  Currently on Lovenox for VTE px- last dose 7/25 @ 2048 Pharmacy consulted to start heparin for ACS. Baseline labs: CBC WNL, DDimer increased to 5.72   Goal of Therapy:  Heparin level 0.3-0.7 units/ml Monitor platelets by anticoagulation protocol: Yes   Plan:  Heparin 3500 units IV x1 followed by infusion at 1100 units/hr  Check heparin level in 6hr Daily heparin level & CBC while on heparin Monitor closely for s/sx of bleeding F/U cardiology recommendations    2049 PharmD, BCPS 05/02/2020,5:22 AM

## 2020-05-02 NOTE — Progress Notes (Signed)
Pharmacy - IV heparin  Assessment:    Please see note from [facility]) Doran Durand, PharmD earlier today for full details.  Briefly, 59 y.o. female on IV heparin for elevated troponins, r/o ACS   First heparin level SUPRAtherapeutic at 0.83 on 1100 units/hr  Per RN, heparin infusing in PIV while lab drawn through CVC  No bleeding or infusion issues per RN  Plan:   Reduce IV heparin to 850 units/hr  Recheck 8-hr heparin level  Bernadene Person, PharmD, BCPS [phone] 05/02/2020, 2:39 PM

## 2020-05-02 NOTE — Progress Notes (Addendum)
Initial Nutrition Assessment  DOCUMENTATION CODES:   Obesity unspecified  INTERVENTION:  Via OGT: -Transition pt to Vital 1.2 cal @ 51ml/hr ( ) -38ml Prosource TF BID -Free water per CCM  Tube feeding will provide 1312 kcals, 116 grams of protein, free water   NUTRITION DIAGNOSIS:   Inadequate oral intake related to inability to eat as evidenced by NPO status.    GOAL:   Patient will meet greater than or equal to 90% of their needs    MONITOR:   TF tolerance, Vent status, Weight trends, Labs, I & O's  REASON FOR ASSESSMENT:   Consult Enteral/tube feeding initiation and management  ASSESSMENT:   Pt admitted with severe ARDS, COVID-19 pneumonia. PMH includes asthma, GERD.   7/25 pt intubated; OGT placed (gastric, verified via xray)  Discussed pt with RN.   Pt is proning.  Patient is currently intubated on ventilator support MV: 10.9 L/min Temp (24hrs), Avg:100.9 F (38.3 C), Min:98.2 F (36.8 C), Max:101.9 F (38.8 C)  Current TF via OGT: Vital High Protein @ 34ml/hr, 20ml Prosource TF BID  Labs: K+ 5.5(H), Phosphorus 6.6 (H),  CBGs 166-228 Medications: Novolog, Solu-medrol, Protonix Drips: Fentanyl, Vecuronium   Diet Order:   Diet Order            Diet NPO time specified  Diet effective now                 EDUCATION NEEDS:   Not appropriate for education at this time  Skin:  Skin Assessment: Reviewed RN Assessment  Last BM:  PTA  Height:   Ht Readings from Last 1 Encounters:  2020/05/14 5\' 4"  (1.626 m)    Weight:   Wt Readings from Last 3 Encounters:  05-14-2020 (!) 101.7 kg  02/15/15 103.5 kg  07/30/12 98 kg    Ideal Body Weight:  54.54 kg  BMI:  Body mass index is 38.49 kg/m.  Estimated Nutritional Needs:   Kcal:  08/01/12  Protein:  110-120  Fluid:  >2L/d    [id number], MS, RD, LDN RD pager number and weekend/on-call pager number located in Amion.

## 2020-05-02 NOTE — Progress Notes (Signed)
eLink Physician-Brief Progress Note Patient Name: [name] DOB: 1960/11/12 MRN: [id number]   Date of Service  05/02/2020  HPI/Events of Note  Multiple issues: 1. K+ = 5.5, 2. Troponin = 3131 - EKG reveals sinus tachycardi, Right bundle branch block, Left anterior fascicular block, Minimal voltage criteria for LVH, may be normal variant ( R in aV and 3. Can't use ASA d/t allergy and  Oliguria - CVP = 9.   eICU Interventions  Plan: 1. Kayexalate 30 gm PR now. 2. Repeat BMP at 11 AM. 3. Heparin IV infusion per pharmacy. 4. D/C Lovenox [place]. 5. Metoprolol 2.5 mg IV now and Q 6 hours.  6. Bolus with 0.9 NaCl 500 mL IV over 30 minutes now.      Intervention Category Major Interventions: Other:  Lenell Antu 05/02/2020, 5:17 AM

## 2020-05-02 NOTE — TOC Initial Note (Signed)
Transition of Care [facility]) - Initial/Assessment Note    Patient Details  Name: Donna Campbell MRN: [id number] Date of Birth: Mar 01, 1961  Transition of Care [facility]) CM/SW Contact:    Golda Acre, RN Phone Number: 05/02/2020, 8:30 AM  Clinical Narrative:                 s- fromhome with family, has pcp, covid + resp failure with vent reqiured. Iv sedation and paralytics, iv remdesivir through [id number]. P-will follow for progression and toc needs.  Expected Discharge Plan: Home/Self Care Barriers to Discharge: Continued Medical Work up   Patient Goals and CMS Choice Patient states their goals for this hospitalization and ongoing recovery are:: on vent unable to state at present time      Expected Discharge Plan and Services Expected Discharge Plan: Home/Self Care   Discharge Planning Services: CM Consult   Living arrangements for the past 2 months: Single Family Home                                      Prior Living Arrangements/Services Living arrangements for the past 2 months: Single Family Home Lives with:: Spouse          Need for Family Participation in Patient Care: Yes (Comment) Care giver support system in place?: Yes (comment)   Criminal Activity/Legal Involvement Pertinent to Current Situation/Hospitalization: No - Comment as needed  Activities of Daily Living      Permission Sought/Granted                  Emotional Assessment Appearance:: Appears stated age     Orientation: : Fluctuating Orientation (Suspected and/or reported Sundowners) Alcohol / Substance Use: Not Applicable Psych Involvement: No (comment)  Admission diagnosis:  Shortness of breath [R06.02] Encounter for imaging study to confirm orogastric (OG) tube placement [Z01.89] Acute respiratory failure with hypoxia (HCC) [J96.01] COVID-19 virus infection [U07.1] Acute respiratory distress syndrome (ARDS) due to COVID-19 virus (HCC) [U07.1, J80] Patient Active Problem List    Diagnosis Date Noted  . Acute respiratory distress syndrome (ARDS) due to COVID-19 virus (HCC) 05-10-20  . 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 ABDOMINAL PAIN-GENERALIZED 03/10/2008  . ABNORMAL FINDINGS GI TRACT 03/10/2008  . NONSPEC ABN FIND-RADIOLOGY-OTH EXAM OF ABD AREA 03/10/2008   PCP:  Georgann Housekeeper, MD Pharmacy:   [facility] [address] ([place]), [place] - [address] [place] [address] Phone: [phone] Fax: [phone]  [id number] [place], [place] - [address] AT [facility] [address] [place] [address] Phone: [phone] Fax: [phone]     Social Determinants of Health (SDOH) Interventions    Readmission Risk Interventions No flowsheet data found.

## 2020-05-02 NOTE — Progress Notes (Signed)
Attempted to call husband to give update on patient status. No answer. Will try again in AM

## 2020-05-03 ENCOUNTER — Inpatient Hospital Stay (HOSPITAL_COMMUNITY): Payer: BC Managed Care – PPO

## 2020-05-03 DIAGNOSIS — J9601 Acute respiratory failure with hypoxia: Secondary | ICD-10-CM

## 2020-05-03 LAB — CBC WITH DIFFERENTIAL/PLATELET
Abs Immature Granulocytes: 0.04 10*3/uL (ref 0.00–0.07)
Basophils Absolute: 0 10*3/uL (ref 0.0–0.1)
Basophils Relative: 0 %
Eosinophils Absolute: 0 10*3/uL (ref 0.0–0.5)
Eosinophils Relative: 0 %
HCT: 37.1 % (ref 36.0–46.0)
Hemoglobin: 11.9 g/dL — ABNORMAL LOW (ref 12.0–15.0)
Immature Granulocytes: 1 %
Lymphocytes Relative: 11 %
Lymphs Abs: 0.5 10*3/uL — ABNORMAL LOW (ref 0.7–4.0)
MCH: 29.8 pg (ref 26.0–34.0)
MCHC: 32.1 g/dL (ref 30.0–36.0)
MCV: 93 fL (ref 80.0–100.0)
Monocytes Absolute: 0.2 10*3/uL (ref 0.1–1.0)
Monocytes Relative: 4 %
Neutro Abs: 4.2 10*3/uL (ref 1.7–7.7)
Neutrophils Relative %: 84 %
Platelets: 235 10*3/uL (ref 150–400)
RBC: 3.99 MIL/uL (ref 3.87–5.11)
RDW: 14.2 % (ref 11.5–15.5)
WBC: 4.9 10*3/uL (ref 4.0–10.5)
nRBC: 0.6 % — ABNORMAL HIGH (ref 0.0–0.2)

## 2020-05-03 LAB — ECHOCARDIOGRAM COMPLETE
Area-P 1/2: 3.6 cm2
Calc EF: 43.8 %
Height: 64 in
Single Plane A2C EF: 36.7 %
Single Plane A4C EF: 50.8 %
Weight: 3721.36 oz

## 2020-05-03 LAB — COMPREHENSIVE METABOLIC PANEL
ALT: 73 U/L — ABNORMAL HIGH (ref 0–44)
AST: 70 U/L — ABNORMAL HIGH (ref 15–41)
Albumin: 2.8 g/dL — ABNORMAL LOW (ref 3.5–5.0)
Alkaline Phosphatase: 82 U/L (ref 38–126)
Anion gap: 11 (ref 5–15)
BUN: 47 mg/dL — ABNORMAL HIGH (ref 6–20)
CO2: 23 mmol/L (ref 22–32)
Calcium: 8.3 mg/dL — ABNORMAL LOW (ref 8.9–10.3)
Chloride: 105 mmol/L (ref 98–111)
Creatinine, Ser: 1.47 mg/dL — ABNORMAL HIGH (ref 0.44–1.00)
GFR calc Af Amer: 45 mL/min — ABNORMAL LOW (ref >60)
GFR calc non Af Amer: 39 mL/min — ABNORMAL LOW (ref >60)
Glucose, Bld: 227 mg/dL — ABNORMAL HIGH (ref 70–99)
Potassium: 4.2 mmol/L (ref 3.5–5.1)
Sodium: 139 mmol/L (ref 135–145)
Total Bilirubin: 0.4 mg/dL (ref 0.3–1.2)
Total Protein: 6.6 g/dL (ref 6.5–8.1)

## 2020-05-03 LAB — GLUCOSE, CAPILLARY
Glucose-Capillary: 188 mg/dL — ABNORMAL HIGH (ref 70–99)
Glucose-Capillary: 197 mg/dL — ABNORMAL HIGH (ref 70–99)
Glucose-Capillary: 209 mg/dL — ABNORMAL HIGH (ref 70–99)
Glucose-Capillary: 242 mg/dL — ABNORMAL HIGH (ref 70–99)
Glucose-Capillary: 245 mg/dL — ABNORMAL HIGH (ref 70–99)
Glucose-Capillary: 255 mg/dL — ABNORMAL HIGH (ref 70–99)

## 2020-05-03 LAB — MAGNESIUM: Magnesium: 2.3 mg/dL (ref 1.7–2.4)

## 2020-05-03 LAB — BLOOD GAS, ARTERIAL
Acid-base deficit: 5.7 mmol/L — ABNORMAL HIGH (ref 0.0–2.0)
Bicarbonate: 21.8 mmol/L (ref 20.0–28.0)
FIO2: 80
O2 Saturation: 98.6 %
Patient temperature: 98.4
pCO2 arterial: 54.5 mmHg — ABNORMAL HIGH (ref 32.0–48.0)
pH, Arterial: 7.226 — ABNORMAL LOW (ref 7.350–7.450)
pO2, Arterial: 130 mmHg — ABNORMAL HIGH (ref 83.0–108.0)

## 2020-05-03 LAB — PHOSPHORUS: Phosphorus: 4.6 mg/dL (ref 2.5–4.6)

## 2020-05-03 LAB — D-DIMER, QUANTITATIVE: D-Dimer, Quant: 3.82 ug/mL-FEU — ABNORMAL HIGH (ref 0.00–0.50)

## 2020-05-03 LAB — C-REACTIVE PROTEIN: CRP: 6.6 mg/dL — ABNORMAL HIGH (ref <1.0)

## 2020-05-03 LAB — FERRITIN: Ferritin: 1589 ng/mL — ABNORMAL HIGH (ref 11–307)

## 2020-05-03 LAB — HEPARIN LEVEL (UNFRACTIONATED): Heparin Unfractionated: 0.5 IU/mL (ref 0.30–0.70)

## 2020-05-03 MED ORDER — METHYLPREDNISOLONE SODIUM SUCC 125 MG IJ SOLR
60.0000 mg | Freq: Two times a day (BID) | INTRAMUSCULAR | Status: DC
  Administered 2020-05-03 – 2020-05-10 (×14): 60 mg via INTRAVENOUS
  Filled 2020-05-03 (×13): qty 2

## 2020-05-03 MED ORDER — ENOXAPARIN SODIUM 60 MG/0.6ML ~~LOC~~ SOLN
50.0000 mg | Freq: Every day | SUBCUTANEOUS | Status: DC
  Administered 2020-05-03 – 2020-05-15 (×13): 50 mg via SUBCUTANEOUS
  Filled 2020-05-03 (×14): qty 0.5

## 2020-05-03 MED ORDER — SODIUM CHLORIDE 0.9 % IV SOLN
INTRAVENOUS | Status: DC | PRN
  Administered 2020-05-03 – 2020-05-06 (×2): 250 mL via INTRAVENOUS
  Administered 2020-05-17: 500 mL via INTRAVENOUS

## 2020-05-03 MED ORDER — ENOXAPARIN SODIUM 40 MG/0.4ML ~~LOC~~ SOLN: 40.0000 mg | SUBCUTANEOUS | Status: DC

## 2020-05-03 MED ORDER — INSULIN DETEMIR 100 UNIT/ML ~~LOC~~ SOLN
10.0000 [IU] | Freq: Two times a day (BID) | SUBCUTANEOUS | Status: DC
  Administered 2020-05-03 – 2020-05-04 (×3): 10 [IU] via SUBCUTANEOUS
  Filled 2020-05-03 (×3): qty 0.1

## 2020-05-03 NOTE — Progress Notes (Signed)
  Echocardiogram 2D Echocardiogram has been performed.  Donna Campbell 05/03/2020, 1:27 PM

## 2020-05-03 NOTE — Progress Notes (Signed)
ANTICOAGULATION CONSULT NOTE  Pharmacy Consult for Heparin Indication: chest pain/ACS  Allergies  Allergen Reactions  . Naproxen Hives    Pt says she can take ibuprofen  . Sulfonamide Derivatives Hives    Patient Measurements: Height: 5\' 4"  (162.6 cm) Weight: (!) 101.7 kg (224 lb 3.3 oz) IBW/kg (Calculated) : 54.7 HEPARIN DW (KG): 78.4  Vital Signs: Temp: 98.6 F (37 C) (07/27 0100) Temp Source: Bladder (07/27 0000) BP: 103/66 (07/27 0100) Pulse Rate: 78 (07/27 0100)  Labs: Recent Labs    04/09/2020 1610 04/07/2020 1610 05/02/20 0300 05/02/20 1130 05/02/20 1309 05/02/20 1523 05/02/20 2302  HGB 13.7  --  12.9  --   --   --   --   HCT 41.4  --  40.1  --   --   --   --   PLT 323  --  272  --   --   --   --   HEPARINUNFRC  --   --   --   --  0.83*  --  0.50  CREATININE 1.14*   < > 1.39* 1.32*  --  1.22*  --   TROPONINIHS 67*   < > 3,131* 2,089*  --  1,615*  --    < > = values in this interval not displayed.    Estimated Creatinine Clearance: 58.3 mL/min (A) (by C-G formula based on SCr of 1.22 mg/dL (H)).   Medical History: Past Medical History:  Diagnosis Date  . Allergy   . Asthma   . GERD (gastroesophageal reflux disease)   . Shortness of breath    sometimes on exertion    Medications:  Infusions:  . feeding supplement (VITAL AF 1.2 CAL) 1,000 mL (05/02/20 1748)  . fentaNYL infusion INTRAVENOUS 75 mcg/hr (05/02/20 2146)  . heparin 850 Units/hr (05/03/20 0024)  . midazolam 2 mg/hr (05/03/20 0024)  . remdesivir 100 mg in NS 100 mL    . vecuronium (NORCURON) infusion 0.3 mcg/kg/min (05/03/20 0024)    Assessment: 59 yo COVID+ F. Troponin elevated; DDimer increasing.  Currently on Lovenox for VTE px- last dose 7/25 @ 2048 Pharmacy consulted to start heparin for ACS. Baseline labs: CBC WNL, DDimer increased to 5.72   05/03/2020: - Heparin level therapeutic (0.5) on heparin 850 units/hr - No bleeding or infusion related issues reported by RN  Goal of  Therapy:  Heparin level 0.3-0.7 units/ml Monitor platelets by anticoagulation protocol: Yes   Plan:  Continue heparin at 850 units/hr  Re-check confirmatory heparin level in 6hr Daily heparin level & CBC while on heparin Monitor closely for s/sx of bleeding   05/05/2020 PharmD, BCPS 05/03/2020,2:16 AM

## 2020-05-03 NOTE — Progress Notes (Signed)
NAME:  Donna Campbell, MRN:  [id number], DOB:  06-02-1961, LOS: 2 ADMISSION DATE:  04/22/2020, CONSULTATION DATE:  04/17/2020 REFERRING MD:  EDP, CHIEF COMPLAINT: ARDS, COVID-19 pneumonia  Brief History   59 year old unvaccinated patient with history of asthma, obesity Diagnosed with COVID-19 on 5/21.  Admitted with progressive hypoxia.  Intubated in the ED with sats in the 30s.  PCCM called for admission  Past Medical History    has a past medical history of Allergy, Asthma, GERD (gastroesophageal reflux disease), and Shortness of breath.  Significant Hospital Events   7/25 Admit, prone, paralyzed  Consults:  PCCM  Procedures:  ETT 7/25 Lt CVL 7/25  Significant Diagnostic Tests:  Chest x-ray 7/25-diffuse bilateral interstitial infiltrate  Micro Data:  Blood culture  Antimicrobials/COVID Rx  Remdesivir 7/25 >> Actemra  X 2-  7/25, 7/26 Solu-Medrol 7/25 >>  Interim history/subjective:   P/F ratio improved yesterday and hence did not proned again Remains on paralytics  Objective   Blood pressure 128/73, pulse 79, temperature 98.6 F (37 C), temperature source Bladder, resp. rate (!) 35, height 5\' 4"  (1.626 m), weight (!) 105.5 kg, last menstrual period 07/17/2012, SpO2 99 %. CVP:  [5 mmHg-13 mmHg] 13 mmHg  Vent Mode: PRVC FiO2 (%):  [70 %-90 %] 70 % Set Rate:  [18 bmp-35 bmp] 18 bmp Vt Set:  [330 mL] 330 mL PEEP:  [18 cmH20] 18 cmH20 Plateau Pressure:  [25 cmH20-29 cmH20] 28 cmH20   Intake/Output Summary (Last 24 hours) at 05/03/2020 05/05/2020 Last data filed at 05/03/2020 0900 Gross per 24 hour  Intake 1932.5 ml  Output 600 ml  Net 1332.5 ml   Filed Weights   04/28/2020 1800 05/03/20 0435  Weight: (!) 101.7 kg (!) 105.5 kg    Examination: Gen:      No acute distress HEENT:  EOMI, sclera anicteric Neck:     No masses; no thyromegaly, ETT Lungs:    Clear to auscultation bilaterally; normal respiratory effort CV:         Regular rate and rhythm; no murmurs Abd:       + bowel sounds; soft, non-tender; no palpable masses, no distension Ext:    No edema; adequate peripheral perfusion Skin:      Warm and dry; no rash Neuro: Sedated  Labs reviewed Glucose two twenty-seven, BUN/creatinine 47/1.47, troponin I 615 WBC 4.9, hemoglobin 11.9, platelets 235 Chest x-ray 7/27-persistent bilateral infiltrates  Resolved Hospital Problem list   Hyperkalemia  Assessment & Plan:  Severe ARDS, COVID-19 pneumonia 6 cc/kg tidal volume ventilation Wean off paralytics today Goal driving pressure less than fifteen Follow ABG Has received Actemra Continue remdesivir, steroids.  Reduce Solu-Medrol dose to 60mg q12 as blood sugars are high  Hyperglycemia SSI coverage Increase Levemir to 10 units  AKI Follow labs  Elevated troponin, likely demand ischemia We will stop heparin and switch to standard Lovenox Follow echocardiogram  Goals of care Husband requested full code  Best practice:  Diet: Tube feeds Pain/Anxiety/Delirium protocol (if indicated): Fentanyl, Versed drip VAP protocol (if indicated): Ordered DVT prophylaxis: Lovenox GI prophylaxis: PPI Glucose control: SSI Mobility: Bed Code Status: Full Family Communication: Husband updated daily Disposition: ICU  Critical care time:    The patient is critically ill with multiple organ system failure and requires high complexity decision making for assessment and support, frequent evaluation and titration of therapies, advanced monitoring, review of radiographic studies and interpretation of complex data.   Critical Care Time devoted to patient care services, exclusive  of separately billable procedures, described in this note is 35 minutes.   Chilton Greathouse MD [facility] Please see [email or web address] for pager details.  05/03/2020, 9:26 AM

## 2020-05-04 ENCOUNTER — Inpatient Hospital Stay (HOSPITAL_COMMUNITY): Payer: BC Managed Care – PPO

## 2020-05-04 DIAGNOSIS — U071 COVID-19: Secondary | ICD-10-CM | POA: Diagnosis not present

## 2020-05-04 DIAGNOSIS — J80 Acute respiratory distress syndrome: Secondary | ICD-10-CM | POA: Diagnosis not present

## 2020-05-04 LAB — CBC WITH DIFFERENTIAL/PLATELET
Abs Immature Granulocytes: 0.07 10*3/uL (ref 0.00–0.07)
Basophils Absolute: 0 10*3/uL (ref 0.0–0.1)
Basophils Relative: 0 %
Eosinophils Absolute: 0 10*3/uL (ref 0.0–0.5)
Eosinophils Relative: 0 %
HCT: 33.9 % — ABNORMAL LOW (ref 36.0–46.0)
Hemoglobin: 10.7 g/dL — ABNORMAL LOW (ref 12.0–15.0)
Immature Granulocytes: 1 %
Lymphocytes Relative: 8 %
Lymphs Abs: 0.5 10*3/uL — ABNORMAL LOW (ref 0.7–4.0)
MCH: 29.2 pg (ref 26.0–34.0)
MCHC: 31.6 g/dL (ref 30.0–36.0)
MCV: 92.6 fL (ref 80.0–100.0)
Monocytes Absolute: 0.2 10*3/uL (ref 0.1–1.0)
Monocytes Relative: 3 %
Neutro Abs: 4.9 10*3/uL (ref 1.7–7.7)
Neutrophils Relative %: 88 %
Platelets: 216 10*3/uL (ref 150–400)
RBC: 3.66 MIL/uL — ABNORMAL LOW (ref 3.87–5.11)
RDW: 13.8 % (ref 11.5–15.5)
WBC: 5.6 10*3/uL (ref 4.0–10.5)
nRBC: 0.5 % — ABNORMAL HIGH (ref 0.0–0.2)

## 2020-05-04 LAB — COMPREHENSIVE METABOLIC PANEL
ALT: 60 U/L — ABNORMAL HIGH (ref 0–44)
AST: 42 U/L — ABNORMAL HIGH (ref 15–41)
Albumin: 2.9 g/dL — ABNORMAL LOW (ref 3.5–5.0)
Alkaline Phosphatase: 72 U/L (ref 38–126)
Anion gap: 10 (ref 5–15)
BUN: 68 mg/dL — ABNORMAL HIGH (ref 6–20)
CO2: 23 mmol/L (ref 22–32)
Calcium: 8.5 mg/dL — ABNORMAL LOW (ref 8.9–10.3)
Chloride: 109 mmol/L (ref 98–111)
Creatinine, Ser: 1.73 mg/dL — ABNORMAL HIGH (ref 0.44–1.00)
GFR calc Af Amer: 37 mL/min — ABNORMAL LOW (ref >60)
GFR calc non Af Amer: 32 mL/min — ABNORMAL LOW (ref >60)
Glucose, Bld: 222 mg/dL — ABNORMAL HIGH (ref 70–99)
Potassium: 4.6 mmol/L (ref 3.5–5.1)
Sodium: 142 mmol/L (ref 135–145)
Total Bilirubin: 0.3 mg/dL (ref 0.3–1.2)
Total Protein: 6.2 g/dL — ABNORMAL LOW (ref 6.5–8.1)

## 2020-05-04 LAB — GLUCOSE, CAPILLARY
Glucose-Capillary: 196 mg/dL — ABNORMAL HIGH (ref 70–99)
Glucose-Capillary: 221 mg/dL — ABNORMAL HIGH (ref 70–99)
Glucose-Capillary: 225 mg/dL — ABNORMAL HIGH (ref 70–99)
Glucose-Capillary: 228 mg/dL — ABNORMAL HIGH (ref 70–99)
Glucose-Capillary: 232 mg/dL — ABNORMAL HIGH (ref 70–99)

## 2020-05-04 LAB — BLOOD GAS, ARTERIAL
Acid-base deficit: 2 mmol/L (ref 0.0–2.0)
Bicarbonate: 23.6 mmol/L (ref 20.0–28.0)
FIO2: 70
O2 Saturation: 90.8 %
Patient temperature: 100.2
pCO2 arterial: 48.6 mmHg — ABNORMAL HIGH (ref 32.0–48.0)
pH, Arterial: 7.313 — ABNORMAL LOW (ref 7.350–7.450)
pO2, Arterial: 69.4 mmHg — ABNORMAL LOW (ref 83.0–108.0)

## 2020-05-04 LAB — D-DIMER, QUANTITATIVE: D-Dimer, Quant: 12.21 ug/mL-FEU — ABNORMAL HIGH (ref 0.00–0.50)

## 2020-05-04 LAB — PHOSPHORUS: Phosphorus: 2.8 mg/dL (ref 2.5–4.6)

## 2020-05-04 LAB — FERRITIN: Ferritin: 1054 ng/mL — ABNORMAL HIGH (ref 11–307)

## 2020-05-04 LAB — MAGNESIUM: Magnesium: 2.6 mg/dL — ABNORMAL HIGH (ref 1.7–2.4)

## 2020-05-04 LAB — C-REACTIVE PROTEIN: CRP: 2.7 mg/dL — ABNORMAL HIGH (ref <1.0)

## 2020-05-04 MED ORDER — ARTIFICIAL TEARS OPHTHALMIC OINT
1.0000 "application " | TOPICAL_OINTMENT | Freq: Three times a day (TID) | OPHTHALMIC | Status: DC
  Administered 2020-05-04 – 2020-05-10 (×19): 1 via OPHTHALMIC
  Filled 2020-05-04: qty 3.5

## 2020-05-04 MED ORDER — HYDROMORPHONE HCL 1 MG/ML IJ SOLN
1.0000 mg | Freq: Once | INTRAMUSCULAR | Status: AC
  Administered 2020-05-04: 1 mg via INTRAVENOUS

## 2020-05-04 MED ORDER — FUROSEMIDE 10 MG/ML IJ SOLN
40.0000 mg | Freq: Four times a day (QID) | INTRAMUSCULAR | Status: AC
  Administered 2020-05-04 (×2): 40 mg via INTRAVENOUS
  Filled 2020-05-04 (×2): qty 4

## 2020-05-04 MED ORDER — POLYETHYLENE GLYCOL 3350 17 G PO PACK
17.0000 g | PACK | Freq: Every day | ORAL | Status: DC
  Administered 2020-05-05 – 2020-05-17 (×11): 17 g via ORAL
  Filled 2020-05-04 (×11): qty 1

## 2020-05-04 MED ORDER — VECURONIUM BROMIDE 10 MG IV SOLR
10.0000 mg | INTRAVENOUS | Status: DC | PRN
  Administered 2020-05-04 – 2020-05-08 (×11): 10 mg via INTRAVENOUS
  Filled 2020-05-04 (×11): qty 10

## 2020-05-04 MED ORDER — INSULIN DETEMIR 100 UNIT/ML ~~LOC~~ SOLN
20.0000 [IU] | Freq: Two times a day (BID) | SUBCUTANEOUS | Status: DC
  Administered 2020-05-04 – 2020-05-06 (×4): 20 [IU] via SUBCUTANEOUS
  Filled 2020-05-04 (×4): qty 0.2

## 2020-05-04 MED ORDER — VECURONIUM BROMIDE 10 MG IV SOLR
0.0000 ug/kg/min | INTRAVENOUS | Status: DC
  Administered 2020-05-04: 1 ug/kg/min via INTRAVENOUS
  Administered 2020-05-05: 0.524 ug/kg/min via INTRAVENOUS
  Filled 2020-05-04: qty 100

## 2020-05-04 MED ORDER — SODIUM CHLORIDE 0.9 % IV SOLN
0.5000 mg/h | INTRAVENOUS | Status: DC
  Administered 2020-05-04: 1 mg/h via INTRAVENOUS
  Administered 2020-05-04: 4 mg/h via INTRAVENOUS
  Administered 2020-05-05: 2 mg/h via INTRAVENOUS
  Administered 2020-05-06 – 2020-05-08 (×6): 4 mg/h via INTRAVENOUS
  Administered 2020-05-09 (×2): 3 mg/h via INTRAVENOUS
  Administered 2020-05-10 – 2020-05-11 (×3): 4 mg/h via INTRAVENOUS
  Administered 2020-05-12: 2 mg/h via INTRAVENOUS
  Administered 2020-05-13 (×2): 4 mg/h via INTRAVENOUS
  Administered 2020-05-14: 3 mg/h via INTRAVENOUS
  Administered 2020-05-14 – 2020-05-16 (×3): 4 mg/h via INTRAVENOUS
  Administered 2020-05-17: 3.5 mg/h via INTRAVENOUS
  Administered 2020-05-17 – 2020-05-20 (×7): 4 mg/h via INTRAVENOUS
  Filled 2020-05-04 (×41): qty 5

## 2020-05-04 MED ORDER — HYDROMORPHONE BOLUS VIA INFUSION
0.5000 mg | INTRAVENOUS | Status: DC | PRN
  Administered 2020-05-06 – 2020-05-18 (×10): 0.5 mg via INTRAVENOUS
  Filled 2020-05-04: qty 1

## 2020-05-04 MED ORDER — VECURONIUM BOLUS VIA INFUSION
5.0000 mg | Freq: Once | INTRAVENOUS | Status: AC
  Administered 2020-05-04: 5 mg via INTRAVENOUS
  Filled 2020-05-04: qty 5

## 2020-05-04 MED ORDER — DOCUSATE SODIUM 50 MG/5ML PO LIQD
100.0000 mg | Freq: Two times a day (BID) | ORAL | Status: DC
  Administered 2020-05-05 – 2020-05-17 (×18): 100 mg via ORAL
  Filled 2020-05-04 (×22): qty 10

## 2020-05-04 MED ORDER — VECURONIUM BOLUS VIA INFUSION
10.0000 mg | Freq: Once | INTRAVENOUS | Status: AC
  Administered 2020-05-04: 10 mg via INTRAVENOUS
  Filled 2020-05-04: qty 10

## 2020-05-04 NOTE — Progress Notes (Signed)
eLink Physician-Brief Progress Note Patient Name: ULONDA KLOSOWSKI DOB: 1960/12/27 MRN: [id number]   Date of Service  05/04/2020  HPI/Events of Note  Notified of vent desynchrony, taken off vecuronium during the day. Fentanyl increased to 300 from 225 and Versed now at 8 from 3 mg. RASS -4 to -5  eICU Interventions  Ordered vecuronium 5 mg CXR and ABG ordered     Intervention Category Major Interventions: Respiratory failure - evaluation and management  Rosalie Gums Sherril Shipman 05/04/2020, 4:02 AM

## 2020-05-04 NOTE — Progress Notes (Signed)
ARDS from COVID 19 pneumonia.  Still hypoxic.  Will add vecuronium infusion.  Coralyn Helling, MD [facility] Pulmonary/Critical Care Pager - [phone] 05/04/2020, 5:19 PM

## 2020-05-04 NOTE — Progress Notes (Signed)
NAME:  Donna Campbell, MRN:  [id number], DOB:  1961-09-28, LOS: 3 ADMISSION DATE:  05/05/2020, CONSULTATION DATE: May 01, 2020 REFERRING MD: EDP, CHIEF COMPLAINT: Dyspnea  Brief History   59 year old female presented to the [facility] long emergency room with dyspnea on May 01, 2020.  She had been diagnosed with COVID on April 26, 2020 at an urgent care.  She was intubated in the evening of July 25.  Past Medical History  GERD Asthma  Significant Hospital Events   July 25 admission/intubation, prone, paralyzed  Consults:    Procedures:  ETT 7/25 Left IJ CVL 7/25  Significant Diagnostic Tests:  7/27 Echo > LVEF 45-50%, grade 1 diastolic dystolic, RV size/function normal, valves OK  Micro Data:  July 20 SARS-CoV-2 positive  Antimicrobials/COVID treatment  July 25 remdesivir>  July 25 Tocilizumab> July 26 July 25 Solu-Medrol 60 mg every 12 hours>   Interim history/subjective:  Prone overnight No acute events Paralytic held yesterday  Objective   Blood pressure (!) 109/59, pulse 78, temperature 98.6 F (37 C), resp. rate 17, height 5\' 4"  (1.626 m), weight (!) 103.3 kg, last menstrual period 07/17/2012, SpO2 96 %. CVP:  [3 mmHg-13 mmHg] 7 mmHg  Vent Mode: PRVC FiO2 (%):  [60 %-90 %] 70 % Set Rate:  [35 bmp] 35 bmp Vt Set:  [330 mL] 330 mL PEEP:  [16 cmH20] 16 cmH20 Plateau Pressure:  [24 cmH20-29 cmH20] 24 cmH20   Intake/Output Summary (Last 24 hours) at 05/04/2020 1512 Last data filed at 05/04/2020 1400 Gross per 24 hour  Intake 2107.1 ml  Output 850 ml  Net 1257.1 ml   Filed Weights   04/23/2020 1800 05/03/20 0435 05/04/20 0500  Weight: (!) 101.7 kg (!) 105.5 kg (!) 103.3 kg    Examination:  General:  In bed on vent HENT: NCAT ETT in place PULM: CTA B, vent supported breathing CV: RRR, no mgr GI: BS+, soft, nontender MSK: normal bulk and tone Neuro: sedated on vent  July 28 chest x-ray images independently reviewed showing severe bilateral airspace disease,  appear increased compared to prior studies  Resolved Hospital Problem list     Assessment & Plan:  ARDS due to COVID 19 pneumonia Continue mechanical ventilation per ARDS protocol Target TVol 6-8cc/kgIBW Target Plateau Pressure < 30cm H20 Target driving pressure less than 15 cm of water Target PaO2 55-65: titrate PEEP/FiO2 per protocol As long as PaO2 to FiO2 ratio is less than 1:150 position in prone position for 16 hours a day Check CVP daily if CVL in place Target CVP less than 4, diurese as necessary Ventilator associated pneumonia prevention protocol 7/28 > P:F ratio worse so needs to be prone, diurese today, continue solumedrol, continue remdesivir  Dyssynchrony:  Need for sedation for mechanical ventilation changed fentanyl to dilaudid today Continue PAD protocol RASS target -4 to -5 PRN paralytic  Hyperglycemia : still poorly controlled Increase levemir Continue SSI Monitor glucose  AKI> worsening, volume overload, may need to consider HD Monitor BMET and UOP Replace electrolytes as needed  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% Tele Supportive care  Goals of care: full code   Best practice:  Diet: tube feeding per protocol Pain/Anxiety/Delirium protocol (if indicated): as above VAP protocol (if indicated): yes DVT prophylaxis: lovenox GI prophylaxis: Pantoprazole for stress ulcer prophylaxis Glucose control: as above Mobility: bed rest Code Status: full Family Communication: updated her husband by phone on 7/28 Disposition: remain in ICU  Labs   CBC: Recent Labs  Lab 04/09/2020 1610 05/02/20 0300 05/03/20 0338 05/04/20 0348  WBC 9.7 6.1 4.9 5.6  NEUTROABS 6.8 5.3 4.2 4.9  HGB 13.7 12.9 11.9* 10.7*  HCT 41.4 40.1 37.1 33.9*  MCV 89.8 91.8 93.0 92.6  PLT 323 272 235 216    Basic Metabolic Panel: Recent Labs  Lab 04/19/2020 1610 05/02/2020 2051 05/02/20 0300 05/02/20 1130 05/02/20 1523 05/03/20 0338 05/04/20 0348    NA   < >  --  135 136 140 139 142  K   < >  --  5.5* 4.5 4.2 4.2 4.6  CL   < >  --  102 102 107 105 109  CO2   < >  --  19* 21* 22 23 23   GLUCOSE   < >  --  270* 321* 235* 227* 222*  BUN   < >  --  32* 32* 34* 47* 68*  CREATININE   < >  --  1.39* 1.32* 1.22* 1.47* 1.73*  CALCIUM   < >  --  8.4* 8.2* 7.8* 8.3* 8.5*  MG  --  2.4 2.4  --   --  2.3 2.6*  PHOS  --  7.5* 6.6*  --   --  4.6 2.8   < > = values in this interval not displayed.   GFR: Estimated Creatinine Clearance: 41.5 mL/min (A) (by C-G formula based on SCr of 1.73 mg/dL (H)). Recent Labs  Lab 04/18/2020 1610 05/02/20 0300 05/03/20 0338 05/04/20 0348  PROCALCITON 0.14  --   --   --   WBC 9.7 6.1 4.9 5.6    Liver Function Tests: Recent Labs  Lab 04/26/2020 1610 05/02/20 0300 05/03/20 0338 05/04/20 0348  AST 89* 102* 70* 42*  ALT 61* 69* 73* 60*  ALKPHOS 76 79 82 72  BILITOT 0.5 0.6 0.4 0.3  PROT 8.2* 7.3 6.6 6.2*  ALBUMIN 3.5 3.3* 2.8* 2.9*   No results for input(s): LIPASE, AMYLASE in the last 168 hours. No results for input(s): AMMONIA in the last 168 hours.  ABG    Component Value Date/Time   PHART 7.313 (L) 05/04/2020 0417   PCO2ART 48.6 (H) 05/04/2020 0417   PO2ART 69.4 (L) 05/04/2020 0417   HCO3 23.6 05/04/2020 0417   ACIDBASEDEF 2.0 05/04/2020 0417   O2SAT 90.8 05/04/2020 0417     Coagulation Profile: No results for input(s): INR, PROTIME in the last 168 hours.  Cardiac Enzymes: No results for input(s): CKTOTAL, CKMB, CKMBINDEX, TROPONINI in the last 168 hours.  HbA1C: No results found for: HGBA1C  CBG: Recent Labs  Lab 05/03/20 2044 05/04/20 0011 05/04/20 0338 05/04/20 0958 05/04/20 1257  GLUCAP 242* 221* 196* 232* 228*     Critical care time: 35 minutes     05/06/20, MD [place] [facility] Pager: [phone] Cell: [phone] If no response, call [phone]

## 2020-05-04 NOTE — Progress Notes (Signed)
ABG obtained on PRVC 330, 35, peep 16 and 70%  Results for Donna Campbell, Donna Campbell (MRN [id number]) as of 05/04/2020 04:29  Ref. Range 05/04/2020 04:17  FIO2 Unknown 70.00  pH, Arterial Latest Ref Range: 7.35 - 7.45  7.313 (L)  pCO2 arterial Latest Ref Range: 32 - 48 mmHg 48.6 (H)  pO2, Arterial Latest Ref Range: 83 - 108 mmHg 69.4 (L)  Acid-base deficit Latest Ref Range: 0.0 - 2.0 mmol/L 2.0  Bicarbonate Latest Ref Range: 20.0 - 28.0 mmol/L 23.6  O2 Saturation Latest Units: % 90.8  Patient temperature Unknown 100.2  Allens test (pass/fail) Latest Ref Range: PASS  PASS

## 2020-05-04 NOTE — TOC Progression Note (Signed)
Transition of Care [facility]) - Progression Note    Patient Details  Name: Donna Campbell MRN: [id number] Date of Birth: 1961-02-20  Transition of [facility]) CM/SW Contact  Golda Acre, RN Phone Number: 05/04/2020, 8:01 AM  Clinical Narrative:    s- remains on vent with paralytics, iv solumedrol and remdesivir through [id number]/ covid markers remains elevated. Vent at 90% fi02 desated to 85% with weaning attempt. P- following for toc needs may need possible ltach..   Expected Discharge Plan: Home/Self Care Barriers to Discharge: Continued Medical Work up  Expected Discharge Plan and Services Expected Discharge Plan: Home/Self Care   Discharge Planning Services: CM Consult   Living arrangements for the past 2 months: Single Family Home                                       Social Determinants of Health (SDOH) Interventions    Readmission Risk Interventions No flowsheet data found.

## 2020-05-05 ENCOUNTER — Inpatient Hospital Stay (HOSPITAL_COMMUNITY): Payer: BC Managed Care – PPO

## 2020-05-05 DIAGNOSIS — U071 COVID-19: Secondary | ICD-10-CM | POA: Diagnosis not present

## 2020-05-05 DIAGNOSIS — J80 Acute respiratory distress syndrome: Secondary | ICD-10-CM | POA: Diagnosis not present

## 2020-05-05 LAB — CBC WITH DIFFERENTIAL/PLATELET
Abs Immature Granulocytes: 0.15 10*3/uL — ABNORMAL HIGH (ref 0.00–0.07)
Basophils Absolute: 0 10*3/uL (ref 0.0–0.1)
Basophils Relative: 0 %
Eosinophils Absolute: 0 10*3/uL (ref 0.0–0.5)
Eosinophils Relative: 0 %
HCT: 33.1 % — ABNORMAL LOW (ref 36.0–46.0)
Hemoglobin: 10.6 g/dL — ABNORMAL LOW (ref 12.0–15.0)
Immature Granulocytes: 2 %
Lymphocytes Relative: 11 %
Lymphs Abs: 0.8 10*3/uL (ref 0.7–4.0)
MCH: 29.9 pg (ref 26.0–34.0)
MCHC: 32 g/dL (ref 30.0–36.0)
MCV: 93.2 fL (ref 80.0–100.0)
Monocytes Absolute: 0.3 10*3/uL (ref 0.1–1.0)
Monocytes Relative: 3 %
Neutro Abs: 6.7 10*3/uL (ref 1.7–7.7)
Neutrophils Relative %: 84 %
Platelets: 197 10*3/uL (ref 150–400)
RBC: 3.55 MIL/uL — ABNORMAL LOW (ref 3.87–5.11)
RDW: 13.8 % (ref 11.5–15.5)
WBC: 8 10*3/uL (ref 4.0–10.5)
nRBC: 0.3 % — ABNORMAL HIGH (ref 0.0–0.2)

## 2020-05-05 LAB — BLOOD GAS, ARTERIAL
Acid-Base Excess: 0.4 mmol/L (ref 0.0–2.0)
Bicarbonate: 25.6 mmol/L (ref 20.0–28.0)
Drawn by: 33147
FIO2: 50
MECHVT: 330 mL
O2 Saturation: 89.5 %
PEEP: 12 cmH2O
Patient temperature: 96.6
RATE: 35 resp/min
pCO2 arterial: 44.4 mmHg (ref 32.0–48.0)
pH, Arterial: 7.373 (ref 7.350–7.450)
pO2, Arterial: 57.9 mmHg — ABNORMAL LOW (ref 83.0–108.0)

## 2020-05-05 LAB — COMPREHENSIVE METABOLIC PANEL
ALT: 50 U/L — ABNORMAL HIGH (ref 0–44)
AST: 30 U/L (ref 15–41)
Albumin: 3 g/dL — ABNORMAL LOW (ref 3.5–5.0)
Alkaline Phosphatase: 73 U/L (ref 38–126)
Anion gap: 8 (ref 5–15)
BUN: 78 mg/dL — ABNORMAL HIGH (ref 6–20)
CO2: 29 mmol/L (ref 22–32)
Calcium: 9 mg/dL (ref 8.9–10.3)
Chloride: 109 mmol/L (ref 98–111)
Creatinine, Ser: 1.68 mg/dL — ABNORMAL HIGH (ref 0.44–1.00)
GFR calc Af Amer: 38 mL/min — ABNORMAL LOW (ref >60)
GFR calc non Af Amer: 33 mL/min — ABNORMAL LOW (ref >60)
Glucose, Bld: 227 mg/dL — ABNORMAL HIGH (ref 70–99)
Potassium: 4.8 mmol/L (ref 3.5–5.1)
Sodium: 146 mmol/L — ABNORMAL HIGH (ref 135–145)
Total Bilirubin: 0.4 mg/dL (ref 0.3–1.2)
Total Protein: 6.3 g/dL — ABNORMAL LOW (ref 6.5–8.1)

## 2020-05-05 LAB — D-DIMER, QUANTITATIVE: D-Dimer, Quant: >20 ug/mL-FEU — ABNORMAL HIGH (ref 0.00–0.50)

## 2020-05-05 LAB — GLUCOSE, CAPILLARY
Glucose-Capillary: 197 mg/dL — ABNORMAL HIGH (ref 70–99)
Glucose-Capillary: 199 mg/dL — ABNORMAL HIGH (ref 70–99)
Glucose-Capillary: 222 mg/dL — ABNORMAL HIGH (ref 70–99)
Glucose-Capillary: 228 mg/dL — ABNORMAL HIGH (ref 70–99)
Glucose-Capillary: 161 mg/dL — ABNORMAL HIGH (ref 70–99)
Glucose-Capillary: 197 mg/dL — ABNORMAL HIGH (ref 70–99)
Glucose-Capillary: 216 mg/dL — ABNORMAL HIGH (ref 70–99)
Glucose-Capillary: 193 mg/dL — ABNORMAL HIGH (ref 70–99)

## 2020-05-05 LAB — MAGNESIUM: Magnesium: 3.1 mg/dL — ABNORMAL HIGH (ref 1.7–2.4)

## 2020-05-05 LAB — FERRITIN: Ferritin: 686 ng/mL — ABNORMAL HIGH (ref 11–307)

## 2020-05-05 LAB — C-REACTIVE PROTEIN: CRP: 1.3 mg/dL — ABNORMAL HIGH (ref <1.0)

## 2020-05-05 LAB — PHOSPHORUS: Phosphorus: 4.1 mg/dL (ref 2.5–4.6)

## 2020-05-05 MED ORDER — CLONAZEPAM 1 MG PO TABS
2.0000 mg | ORAL_TABLET | Freq: Two times a day (BID) | ORAL | Status: DC
  Administered 2020-05-05 – 2020-05-10 (×11): 2 mg
  Filled 2020-05-05 (×11): qty 2

## 2020-05-05 MED ORDER — CHLORHEXIDINE GLUCONATE CLOTH 2 % EX PADS
6.0000 | MEDICATED_PAD | Freq: Every day | CUTANEOUS | Status: DC
  Administered 2020-05-05 – 2020-05-07 (×2): 6 via TOPICAL

## 2020-05-05 MED ORDER — SENNOSIDES 8.8 MG/5ML PO SYRP
5.0000 mL | ORAL_SOLUTION | Freq: Two times a day (BID) | ORAL | Status: DC
  Administered 2020-05-05 – 2020-05-17 (×17): 5 mL via ORAL
  Filled 2020-05-05 (×25): qty 5

## 2020-05-05 MED ORDER — VENLAFAXINE HCL 37.5 MG PO TABS
37.5000 mg | ORAL_TABLET | Freq: Two times a day (BID) | ORAL | Status: DC
  Administered 2020-05-05 – 2020-05-20 (×31): 37.5 mg
  Filled 2020-05-05 (×37): qty 1

## 2020-05-05 MED ORDER — OXYCODONE HCL 5 MG PO TABS
5.0000 mg | ORAL_TABLET | Freq: Four times a day (QID) | ORAL | Status: DC
  Administered 2020-05-05 – 2020-05-09 (×14): 5 mg
  Filled 2020-05-05 (×16): qty 1

## 2020-05-05 MED ORDER — STERILE WATER FOR INJECTION IJ SOLN
INTRAMUSCULAR | Status: AC
  Administered 2020-05-06: 10 mL
  Filled 2020-05-05: qty 10

## 2020-05-05 MED ORDER — FUROSEMIDE 10 MG/ML IJ SOLN
40.0000 mg | Freq: Four times a day (QID) | INTRAMUSCULAR | Status: AC
  Administered 2020-05-05 (×2): 40 mg via INTRAVENOUS
  Filled 2020-05-05 (×2): qty 4

## 2020-05-05 NOTE — Progress Notes (Signed)
NAME:  Donna Campbell, MRN:  [id number], DOB:  [birth year], LOS: 4 ADMISSION DATE:  04/10/2020, CONSULTATION DATE: May 01, 2020 REFERRING MD: EDP, CHIEF COMPLAINT: Dyspnea  Brief History   59 year old female presented to the [facility] long emergency room with dyspnea on May 01, 2020.  She had been diagnosed with COVID on April 26, 2020 at an urgent care.  She was intubated in the evening of July 25.  Past Medical History  GERD Asthma  Significant Hospital Events   July 25 admission/intubation, prone, paralyzed  Consults:    Procedures:  ETT 7/25 Left IJ CVL 7/25  Significant Diagnostic Tests:  7/27 Echo > LVEF 45-50%, grade 1 diastolic dystolic, RV size/function normal, valves OK  Micro Data:  July 20 SARS-CoV-2 positive  Antimicrobials/COVID treatment  July 25 remdesivir>  July 25 Tocilizumab> July 26 July 25 Solu-Medrol 60 mg every 12 hours>   Interim history/subjective:   Prone overnight Required paralytic again 7/28 Net negative yesterday  Objective   Blood pressure 123/76, pulse 58, temperature 97.7 F (36.5 C), resp. rate (!) 35, height 5\' 4"  (1.626 m), weight (!) 102.1 kg, last menstrual period 07/17/2012, SpO2 100 %. CVP:  [3 mmHg-18 mmHg] 16 mmHg  Vent Mode: PRVC FiO2 (%):  [60 %-90 %] 60 % Set Rate:  [35 bmp] 35 bmp Vt Set:  [330 mL] 330 mL PEEP:  [14 cmH20-16 cmH20] 14 cmH20 Plateau Pressure:  [16 cmH20-25 cmH20] 25 cmH20   Intake/Output Summary (Last 24 hours) at 05/05/2020 0749 Last data filed at 05/05/2020 0522 Gross per 24 hour  Intake 1350.09 ml  Output 2530 ml  Net -1179.91 ml   Filed Weights   05/03/20 0435 05/04/20 0500 05/05/20 0500  Weight: (!) 105.5 kg (!) 103.3 kg (!) 102.1 kg    Examination:  General:  In bed on vent, prone HENT: NCAT ETT in place PULM: Distant heart sounds B, vent supported breathing CV: cannot examine due to prone position GI: BS+, soft, nontender MSK: normal bulk and tone Neuro: sedated on vent   July 29  CXR> bilateral airspace disease   Resolved Hospital Problem list     Assessment & Plan:  ARDS due to COVID 19 pneumonia Continue mechanical ventilation per ARDS protocol Target TVol 6-8cc/kgIBW Target Plateau Pressure < 30cm H20 Target driving pressure less than 15 cm of water Target PaO2 55-65: titrate PEEP/FiO2 per protocol As long as PaO2 to FiO2 ratio is less than 1:150 position in prone position for 16 hours a day Check CVP daily if CVL in place Target CVP less than 4, diurese as necessary Ventilator associated pneumonia prevention protocol 7/29 diurese again, check supine ABG to decide if needs prone again  Vent Dyssynchrony, severe  Need for sedation for mechanical ventilation Continue neuromuscular blockade protocol Add oxycodone and clonazepam, try to stop vecuronium drip after Continue dilaudid, versed as ordered  Hyperglycemia : still poorly controlled Continue levemir Continue SSI Continue glucose  AKI> worsening, volume overload, may need to consider HD Monitor BMET and UOP Replace electrolytes as needed  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% Tele Supportive care  Goals of care: remains full code   Best practice:  Diet: tube feeding per protocol Pain/Anxiety/Delirium protocol (if indicated): as above VAP protocol (if indicated): yes DVT prophylaxis: lovenox GI prophylaxis: Pantoprazole for stress ulcer prophylaxis Glucose control: as above Mobility: bed rest Code Status: full Family Communication: updated her husband by phone on 7/29 Disposition: remain in ICU  Labs   CBC:  Recent Labs  Lab May 03, 2020 1610 05/02/20 0300 05/03/20 0338 05/04/20 0348 05/05/20 0400  WBC 9.7 6.1 4.9 5.6 8.0  NEUTROABS 6.8 5.3 4.2 4.9 6.7  HGB 13.7 12.9 11.9* 10.7* 10.6*  HCT 41.4 40.1 37.1 33.9* 33.1*  MCV 89.8 91.8 93.0 92.6 93.2  PLT 323 272 235 216 197    Basic Metabolic Panel: Recent Labs  Lab 05/03/2020 1610 05/03/2020 2051  05/02/20 0300 05/02/20 0300 05/02/20 1130 05/02/20 1523 05/03/20 0338 05/04/20 0348 05/05/20 0400  NA   < >  --  135   < > 136 140 139 142 146*  K   < >  --  5.5*   < > 4.5 4.2 4.2 4.6 4.8  CL   < >  --  102   < > 102 107 105 109 109  CO2   < >  --  19*   < > 21* 22 23 23 29   GLUCOSE   < >  --  270*   < > 321* 235* 227* 222* 227*  BUN   < >  --  32*   < > 32* 34* 47* 68* 78*  CREATININE   < >  --  1.39*   < > 1.32* 1.22* 1.47* 1.73* 1.68*  CALCIUM   < >  --  8.4*   < > 8.2* 7.8* 8.3* 8.5* 9.0  MG  --  2.4 2.4  --   --   --  2.3 2.6* 3.1*  PHOS  --  7.5* 6.6*  --   --   --  4.6 2.8 4.1   < > = values in this interval not displayed.   GFR: Estimated Creatinine Clearance: 42.5 mL/min (A) (by C-G formula based on SCr of 1.68 mg/dL (H)). Recent Labs  Lab 05/03/2020 1610 2020/05/03 1610 05/02/20 0300 05/03/20 0338 05/04/20 0348 05/05/20 0400  PROCALCITON 0.14  --   --   --   --   --   WBC 9.7   < > 6.1 4.9 5.6 8.0   < > = values in this interval not displayed.    Liver Function Tests: Recent Labs  Lab 05/03/20 1610 05/02/20 0300 05/03/20 0338 05/04/20 0348 05/05/20 0400  AST 89* 102* 70* 42* 30  ALT 61* 69* 73* 60* 50*  ALKPHOS 76 79 82 72 73  BILITOT 0.5 0.6 0.4 0.3 0.4  PROT 8.2* 7.3 6.6 6.2* 6.3*  ALBUMIN 3.5 3.3* 2.8* 2.9* 3.0*   No results for input(s): LIPASE, AMYLASE in the last 168 hours. No results for input(s): AMMONIA in the last 168 hours.  ABG    Component Value Date/Time   PHART 7.313 (L) 05/04/2020 0417   PCO2ART 48.6 (H) 05/04/2020 0417   PO2ART 69.4 (L) 05/04/2020 0417   HCO3 23.6 05/04/2020 0417   ACIDBASEDEF 2.0 05/04/2020 0417   O2SAT 90.8 05/04/2020 0417     Coagulation Profile: No results for input(s): INR, PROTIME in the last 168 hours.  Cardiac Enzymes: No results for input(s): CKTOTAL, CKMB, CKMBINDEX, TROPONINI in the last 168 hours.  HbA1C: No results found for: HGBA1C  CBG: Recent Labs  Lab 05/04/20 0011 05/04/20 0338  05/04/20 0958 05/04/20 1257 05/04/20 1620  GLUCAP 221* 196* 232* 228* 225*     Critical care time: 35 minutes     05/06/20, MD [facility] Pager: [phone] Cell: [phone] If no response, call [phone]

## 2020-05-06 DIAGNOSIS — N179 Acute kidney failure, unspecified: Secondary | ICD-10-CM

## 2020-05-06 LAB — CBC WITH DIFFERENTIAL/PLATELET
Abs Immature Granulocytes: 0.44 10*3/uL — ABNORMAL HIGH (ref 0.00–0.07)
Basophils Absolute: 0 10*3/uL (ref 0.0–0.1)
Basophils Relative: 0 %
Eosinophils Absolute: 0 10*3/uL (ref 0.0–0.5)
Eosinophils Relative: 0 %
HCT: 35.1 % — ABNORMAL LOW (ref 36.0–46.0)
Hemoglobin: 10.9 g/dL — ABNORMAL LOW (ref 12.0–15.0)
Immature Granulocytes: 5 %
Lymphocytes Relative: 9 %
Lymphs Abs: 0.8 10*3/uL (ref 0.7–4.0)
MCH: 29.2 pg (ref 26.0–34.0)
MCHC: 31.1 g/dL (ref 30.0–36.0)
MCV: 94.1 fL (ref 80.0–100.0)
Monocytes Absolute: 0.3 10*3/uL (ref 0.1–1.0)
Monocytes Relative: 4 %
Neutro Abs: 6.8 10*3/uL (ref 1.7–7.7)
Neutrophils Relative %: 82 %
Platelets: 204 10*3/uL (ref 150–400)
RBC: 3.73 MIL/uL — ABNORMAL LOW (ref 3.87–5.11)
RDW: 13.9 % (ref 11.5–15.5)
WBC: 8.4 10*3/uL (ref 4.0–10.5)
nRBC: 0.6 % — ABNORMAL HIGH (ref 0.0–0.2)

## 2020-05-06 LAB — C-REACTIVE PROTEIN: CRP: 1 mg/dL — ABNORMAL HIGH (ref <1.0)

## 2020-05-06 LAB — CULTURE, BLOOD (ROUTINE X 2)
Culture: NO GROWTH
Culture: NO GROWTH
Special Requests: ADEQUATE

## 2020-05-06 LAB — PHOSPHORUS: Phosphorus: 4.5 mg/dL (ref 2.5–4.6)

## 2020-05-06 LAB — BLOOD GAS, ARTERIAL
Acid-Base Excess: 2.6 mmol/L — ABNORMAL HIGH (ref 0.0–2.0)
Bicarbonate: 29.3 mmol/L — ABNORMAL HIGH (ref 20.0–28.0)
FIO2: 70
O2 Saturation: 93.2 %
Patient temperature: 98.6
pCO2 arterial: 58.2 mmHg — ABNORMAL HIGH (ref 32.0–48.0)
pH, Arterial: 7.323 — ABNORMAL LOW (ref 7.350–7.450)
pO2, Arterial: 71.8 mmHg — ABNORMAL LOW (ref 83.0–108.0)

## 2020-05-06 LAB — COMPREHENSIVE METABOLIC PANEL
ALT: 53 U/L — ABNORMAL HIGH (ref 0–44)
AST: 36 U/L (ref 15–41)
Albumin: 3.1 g/dL — ABNORMAL LOW (ref 3.5–5.0)
Alkaline Phosphatase: 64 U/L (ref 38–126)
Anion gap: 9 (ref 5–15)
BUN: 95 mg/dL — ABNORMAL HIGH (ref 6–20)
CO2: 30 mmol/L (ref 22–32)
Calcium: 9.2 mg/dL (ref 8.9–10.3)
Chloride: 109 mmol/L (ref 98–111)
Creatinine, Ser: 1.52 mg/dL — ABNORMAL HIGH (ref 0.44–1.00)
GFR calc Af Amer: 43 mL/min — ABNORMAL LOW (ref >60)
GFR calc non Af Amer: 37 mL/min — ABNORMAL LOW (ref >60)
Glucose, Bld: 255 mg/dL — ABNORMAL HIGH (ref 70–99)
Potassium: 4.6 mmol/L (ref 3.5–5.1)
Sodium: 148 mmol/L — ABNORMAL HIGH (ref 135–145)
Total Bilirubin: 0.4 mg/dL (ref 0.3–1.2)
Total Protein: 6.2 g/dL — ABNORMAL LOW (ref 6.5–8.1)

## 2020-05-06 LAB — MAGNESIUM: Magnesium: 3.2 mg/dL — ABNORMAL HIGH (ref 1.7–2.4)

## 2020-05-06 LAB — GLUCOSE, CAPILLARY
Glucose-Capillary: 200 mg/dL — ABNORMAL HIGH (ref 70–99)
Glucose-Capillary: 173 mg/dL — ABNORMAL HIGH (ref 70–99)
Glucose-Capillary: 147 mg/dL — ABNORMAL HIGH (ref 70–99)
Glucose-Capillary: 205 mg/dL — ABNORMAL HIGH (ref 70–99)
Glucose-Capillary: 223 mg/dL — ABNORMAL HIGH (ref 70–99)

## 2020-05-06 LAB — D-DIMER, QUANTITATIVE: D-Dimer, Quant: 17.44 ug/mL-FEU — ABNORMAL HIGH (ref 0.00–0.50)

## 2020-05-06 LAB — FERRITIN: Ferritin: 652 ng/mL — ABNORMAL HIGH (ref 11–307)

## 2020-05-06 MED ORDER — FUROSEMIDE 10 MG/ML IJ SOLN
40.0000 mg | Freq: Every day | INTRAMUSCULAR | Status: DC
  Administered 2020-05-06 – 2020-05-07 (×2): 40 mg via INTRAVENOUS
  Filled 2020-05-06 (×2): qty 4

## 2020-05-06 MED ORDER — LACTULOSE 10 GM/15ML PO SOLN
20.0000 g | Freq: Once | ORAL | Status: AC
  Administered 2020-05-06: 20 g
  Filled 2020-05-06: qty 30

## 2020-05-06 MED ORDER — INSULIN DETEMIR 100 UNIT/ML ~~LOC~~ SOLN
25.0000 [IU] | Freq: Two times a day (BID) | SUBCUTANEOUS | Status: DC
  Administered 2020-05-06 – 2020-05-20 (×29): 25 [IU] via SUBCUTANEOUS
  Filled 2020-05-06 (×30): qty 0.25

## 2020-05-06 MED ORDER — FREE WATER
200.0000 mL | Status: DC
  Administered 2020-05-06 – 2020-05-09 (×17): 200 mL

## 2020-05-06 MED ORDER — STERILE WATER FOR INJECTION IJ SOLN
INTRAMUSCULAR | Status: AC
  Administered 2020-05-06: 10 mL
  Filled 2020-05-06: qty 10

## 2020-05-06 NOTE — Progress Notes (Signed)
NAME:  Donna Campbell, MRN:  [id number], DOB:  January 17, 1961, LOS: 5 ADMISSION DATE:  16-May-2020, CONSULTATION DATE: 2020-05-16 REFERRING MD: EDP, CHIEF COMPLAINT: Dyspnea  Brief History   59 year old female presented to the [facility] long emergency room with dyspnea on May 16, 2020.  She had been diagnosed with COVID on April 26, 2020 at an urgent care.  She was intubated in the evening of July 25.  Past Medical History  GERD Asthma  Significant Hospital Events   July 25 admission/intubation, prone, paralyzed  Consults:    Procedures:  ETT 7/25 Left IJ CVL 7/25  Significant Diagnostic Tests:  7/27 Echo > LVEF 45-50%, grade 1 diastolic dystolic, RV size/function normal, valves OK  Micro Data:  July 20 SARS-CoV-2 positive  Antimicrobials/COVID treatment  July 25 remdesivir>  July 25 Tocilizumab> July 26 July 25 Solu-Medrol 60 mg every 12 hours>   Interim history/subjective:  Critically ill, intubated In prone position. Receiving intermittent vecuronium BIS 40 Good urine output Sedated on Versed and Dilaudid On 70%/PEEP of 12   Objective   Blood pressure (!) 159/82, pulse 81, temperature 99.1 F (37.3 C), resp. rate 16, height 5\' 4"  (1.626 m), weight (!) 98.9 kg, last menstrual period 07/17/2012, SpO2 94 %. CVP:  [4 mmHg-29 mmHg] 9 mmHg  Vent Mode: PRVC FiO2 (%):  [60 %-70 %] 70 % Set Rate:  [35 bmp] 35 bmp Vt Set:  [330 mL] 330 mL PEEP:  [12 cmH20] 12 cmH20 Plateau Pressure:  [22 cmH20] 22 cmH20   Intake/Output Summary (Last 24 hours) at 05/06/2020 1102 Last data filed at 05/06/2020 0600 Gross per 24 hour  Intake 1141.75 ml  Output 2220 ml  Net -1078.25 ml   Filed Weights   05/04/20 0500 05/05/20 0500 05/06/20 0352  Weight: (!) 103.3 kg (!) 102.1 kg (!) 98.9 kg    Examination:  General:  In bed on vent, prone position HENT: NCAT ETT in place PULM: Distant heart sounds B, vent supported breathing , peak pressure 21 CV: cannot examine due to prone  position GI: BS+, soft, nontender MSK: normal bulk and tone Neuro: sedated on vent   Chest x-ray 7/29 personally reviewed which shows bilateral airspace disease Labs show mild hypernatremia, hyperglycemia, increasing BUN, stable creatinine , no leukocytosis, stable anemia  Resolved Hospital Problem list     Assessment & Plan:  ARDS due to COVID 19 pneumonia Continue mechanical ventilation per ARDS protocol Target TVol 6-8cc/kgIBW At Target Plateau Pressure < 30cm H20 Target driving pressure less than 15 cm of water Target PaO2 55-65: titrate PEEP/FiO2 per protocol P/F ratio remains low, will continue prone position for 16 hours alternating with supine for 8 hours Check supine ABG after 6 hours to decide need for prone again Ventilator associated pneumonia prevention protocol   Vent Dyssynchrony, severe  Need for sedation for mechanical ventilation Continue neuromuscular blockade protocol >> as needed vecuronium seems to suffice Add oxycodone and clonazepam Continue dilaudid, versed , goal RASS -5  Hyperglycemia : still poorly controlled Continue levemir Continue SSI Continue glucose  AKI> increasing BUN but stable creatinine Monitor BMET and UOP Replace electrolytes as needed CVP at goal, Lasix 40 IV daily for negative balance  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% Tele Supportive care  Goals of care: remains full code   Best practice:  Diet: tube feeding per protocol Pain/Anxiety/Delirium protocol (if indicated): as above VAP protocol (if indicated): yes DVT prophylaxis: lovenox GI prophylaxis: Pantoprazole for stress ulcer prophylaxis  Glucose control: as above Mobility: bed rest Code Status: full Family Communication: updated her husband by phone on 7/30 Disposition: ICU  Labs   CBC: Recent Labs  Lab 05/02/20 0300 05/03/20 0338 05/04/20 0348 05/05/20 0400 05/06/20 0510  WBC 6.1 4.9 5.6 8.0 8.4  NEUTROABS 5.3 4.2 4.9 6.7  6.8  HGB 12.9 11.9* 10.7* 10.6* 10.9*  HCT 40.1 37.1 33.9* 33.1* 35.1*  MCV 91.8 93.0 92.6 93.2 94.1  PLT 272 235 216 197 204    Basic Metabolic Panel: Recent Labs  Lab 05/02/20 0300 05/02/20 1130 05/02/20 1523 05/03/20 0338 05/04/20 0348 05/05/20 0400 05/06/20 0510  NA 135   < > 140 139 142 146* 148*  K 5.5*   < > 4.2 4.2 4.6 4.8 4.6  CL 102   < > 107 105 109 109 109  CO2 19*   < > 22 23 23 29 30   GLUCOSE 270*   < > 235* 227* 222* 227* 255*  BUN 32*   < > 34* 47* 68* 78* 95*  CREATININE 1.39*   < > 1.22* 1.47* 1.73* 1.68* 1.52*  CALCIUM 8.4*   < > 7.8* 8.3* 8.5* 9.0 9.2  MG 2.4  --   --  2.3 2.6* 3.1* 3.2*  PHOS 6.6*  --   --  4.6 2.8 4.1 4.5   < > = values in this interval not displayed.   GFR: Estimated Creatinine Clearance: 46.1 mL/min (A) (by C-G formula based on SCr of 1.52 mg/dL (H)). Recent Labs  Lab 04/16/2020 1610 05/02/20 0300 05/03/20 0338 05/04/20 0348 05/05/20 0400 05/06/20 0510  PROCALCITON 0.14  --   --   --   --   --   WBC 9.7   < > 4.9 5.6 8.0 8.4   < > = values in this interval not displayed.    Liver Function Tests: Recent Labs  Lab 05/02/20 0300 05/03/20 0338 05/04/20 0348 05/05/20 0400 05/06/20 0510  AST 102* 70* 42* 30 36  ALT 69* 73* 60* 50* 53*  ALKPHOS 79 82 72 73 64  BILITOT 0.6 0.4 0.3 0.4 0.4  PROT 7.3 6.6 6.2* 6.3* 6.2*  ALBUMIN 3.3* 2.8* 2.9* 3.0* 3.1*   No results for input(s): LIPASE, AMYLASE in the last 168 hours. No results for input(s): AMMONIA in the last 168 hours.  ABG    Component Value Date/Time   PHART 7.373 05/05/2020 1254   PCO2ART 44.4 05/05/2020 1254   PO2ART 57.9 (L) 05/05/2020 1254   HCO3 25.6 05/05/2020 1254   ACIDBASEDEF 2.0 05/04/2020 0417   O2SAT 89.5 05/05/2020 1254     Coagulation Profile: No results for input(s): INR, PROTIME in the last 168 hours.  Cardiac Enzymes: No results for input(s): CKTOTAL, CKMB, CKMBINDEX, TROPONINI in the last 168 hours.  HbA1C: No results found for:  HGBA1C  CBG: Recent Labs  Lab 05/05/20 1546 05/05/20 1939 05/05/20 2327 05/06/20 0332 05/06/20 0851  GLUCAP 197* 216* 193* 200* 173*     Critical care time: 35 minutes    05/08/20 MD. FCCP. [facility]  If no response to pager , please call [phone]   05/06/2020

## 2020-05-06 NOTE — TOC Progression Note (Signed)
Transition of Care [facility]) - Progression Note    Patient Details  Name: Donna Campbell MRN: [id number] Date of Birth: 10/26/1960  [facility] CM/SW Contact  Golda Acre, RN Phone Number: 05/06/2020, 8:56 AM  Clinical Narrative:    s- vent at 70% on paralytics,remdesivir until [id number] P=following for progression   Expected Discharge Plan: Home/Self Care Barriers to Discharge: Continued Medical Work up  Expected Discharge Plan and Services Expected Discharge Plan: Home/Self Care   Discharge Planning Services: CM Consult   Living arrangements for the past 2 months: Single Family Home                                       Social Determinants of Health (SDOH) Interventions    Readmission Risk Interventions No flowsheet data found.

## 2020-05-07 ENCOUNTER — Inpatient Hospital Stay (HOSPITAL_COMMUNITY): Payer: BC Managed Care – PPO

## 2020-05-07 LAB — BLOOD GAS, ARTERIAL
Acid-Base Excess: 2.2 mmol/L — ABNORMAL HIGH (ref 0.0–2.0)
Bicarbonate: 29.2 mmol/L — ABNORMAL HIGH (ref 20.0–28.0)
FIO2: 60
MECHVT: 330 mL
O2 Saturation: 94.1 %
PEEP: 14 cmH2O
Patient temperature: 99.2
RATE: 32 resp/min
pCO2 arterial: 61.1 mmHg — ABNORMAL HIGH (ref 32.0–48.0)
pH, Arterial: 7.302 — ABNORMAL LOW (ref 7.350–7.450)
pO2, Arterial: 77.4 mmHg — ABNORMAL LOW (ref 83.0–108.0)

## 2020-05-07 LAB — BASIC METABOLIC PANEL
Anion gap: 10 (ref 5–15)
Anion gap: 12 (ref 5–15)
BUN: 78 mg/dL — ABNORMAL HIGH (ref 6–20)
BUN: 94 mg/dL — ABNORMAL HIGH (ref 6–20)
CO2: 30 mmol/L (ref 22–32)
CO2: 32 mmol/L (ref 22–32)
Calcium: 9.3 mg/dL (ref 8.9–10.3)
Calcium: 9.2 mg/dL (ref 8.9–10.3)
Chloride: 108 mmol/L (ref 98–111)
Chloride: 106 mmol/L (ref 98–111)
Creatinine, Ser: 1.56 mg/dL — ABNORMAL HIGH (ref 0.44–1.00)
Creatinine, Ser: 1.62 mg/dL — ABNORMAL HIGH (ref 0.44–1.00)
GFR calc Af Amer: 42 mL/min — ABNORMAL LOW (ref >60)
GFR calc Af Amer: 40 mL/min — ABNORMAL LOW (ref >60)
GFR calc non Af Amer: 36 mL/min — ABNORMAL LOW (ref >60)
GFR calc non Af Amer: 35 mL/min — ABNORMAL LOW (ref >60)
Glucose, Bld: 263 mg/dL — ABNORMAL HIGH (ref 70–99)
Glucose, Bld: 236 mg/dL — ABNORMAL HIGH (ref 70–99)
Potassium: 5.3 mmol/L — ABNORMAL HIGH (ref 3.5–5.1)
Potassium: 5.9 mmol/L — ABNORMAL HIGH (ref 3.5–5.1)
Sodium: 148 mmol/L — ABNORMAL HIGH (ref 135–145)
Sodium: 150 mmol/L — ABNORMAL HIGH (ref 135–145)

## 2020-05-07 LAB — GLUCOSE, CAPILLARY
Glucose-Capillary: 187 mg/dL — ABNORMAL HIGH (ref 70–99)
Glucose-Capillary: 192 mg/dL — ABNORMAL HIGH (ref 70–99)
Glucose-Capillary: 195 mg/dL — ABNORMAL HIGH (ref 70–99)
Glucose-Capillary: 198 mg/dL — ABNORMAL HIGH (ref 70–99)
Glucose-Capillary: 224 mg/dL — ABNORMAL HIGH (ref 70–99)
Glucose-Capillary: 228 mg/dL — ABNORMAL HIGH (ref 70–99)
Glucose-Capillary: 244 mg/dL — ABNORMAL HIGH (ref 70–99)
Glucose-Capillary: 51 mg/dL — ABNORMAL LOW (ref 70–99)

## 2020-05-07 LAB — CBC
HCT: 36.9 % (ref 36.0–46.0)
Hemoglobin: 11.2 g/dL — ABNORMAL LOW (ref 12.0–15.0)
MCH: 29.9 pg (ref 26.0–34.0)
MCHC: 30.4 g/dL (ref 30.0–36.0)
MCV: 98.7 fL (ref 80.0–100.0)
Platelets: 180 10*3/uL (ref 150–400)
RBC: 3.74 MIL/uL — ABNORMAL LOW (ref 3.87–5.11)
RDW: 14.2 % (ref 11.5–15.5)
WBC: 13.7 10*3/uL — ABNORMAL HIGH (ref 4.0–10.5)
nRBC: 0.3 % — ABNORMAL HIGH (ref 0.0–0.2)

## 2020-05-07 LAB — MAGNESIUM: Magnesium: 3.1 mg/dL — ABNORMAL HIGH (ref 1.7–2.4)

## 2020-05-07 LAB — PHOSPHORUS: Phosphorus: 5.2 mg/dL — ABNORMAL HIGH (ref 2.5–4.6)

## 2020-05-07 MED ORDER — SODIUM CHLORIDE 0.9 % IV SOLN
2.0000 g | INTRAVENOUS | Status: AC
  Administered 2020-05-07: 2 g via INTRAVENOUS
  Filled 2020-05-07: qty 2

## 2020-05-07 MED ORDER — VANCOMYCIN HCL 1500 MG/300ML IV SOLN
1500.0000 mg | INTRAVENOUS | Status: AC
  Administered 2020-05-08 – 2020-05-13 (×6): 1500 mg via INTRAVENOUS
  Filled 2020-05-07 (×6): qty 300

## 2020-05-07 MED ORDER — SODIUM ZIRCONIUM CYCLOSILICATE 10 G PO PACK
10.0000 g | PACK | Freq: Once | ORAL | Status: AC
  Administered 2020-05-07: 10 g
  Filled 2020-05-07: qty 1

## 2020-05-07 MED ORDER — VANCOMYCIN HCL 2000 MG/400ML IV SOLN
2000.0000 mg | INTRAVENOUS | Status: AC
  Administered 2020-05-07: 2000 mg via INTRAVENOUS
  Filled 2020-05-07: qty 400

## 2020-05-07 MED ORDER — SODIUM CHLORIDE 0.9 % IV SOLN
2.0000 g | Freq: Two times a day (BID) | INTRAVENOUS | Status: DC
  Administered 2020-05-08 – 2020-05-10 (×5): 2 g via INTRAVENOUS
  Filled 2020-05-07 (×5): qty 2

## 2020-05-07 NOTE — Progress Notes (Signed)
Pharmacy Antibiotic Note  Donna Campbell is a 59 y.o. female admitted on 04/11/2020 with ARDS due to COVID-19 pneumonia. CXR today: worsening pneumonia throughout both lungs, most confluent in the right lower lobe. Pharmacy consulted for Vancomycin and Cefepime dosing.   Plan: Vancomycin 2g IV x 1, then 1500mg  IV q24h Vancomycin trough level at steady state, as indicated Cefepime 2g IV q12h Monitor renal function, cultures, clinical course  Height: 5\' 4"  (162.6 cm) Weight: (!) 98.9 kg (218 lb 0.6 oz) IBW/kg (Calculated) : 54.7  Temp (24hrs), Avg:99.9 F (37.7 C), Min:99.5 F (37.5 C), Max:100.4 F (38 C)  Recent Labs  Lab 05/03/20 0338 05/03/20 0338 05/04/20 0348 05/05/20 0400 05/06/20 0510 05/07/20 0459 05/07/20 1502  WBC 4.9  --  5.6 8.0 8.4  --  13.7*  CREATININE 1.47*   < > 1.73* 1.68* 1.52* 1.56* 1.62*   < > = values in this interval not displayed.    Estimated Creatinine Clearance: 43.3 mL/min (A) (by C-G formula based on SCr of 1.62 mg/dL (H)).    Allergies  Allergen Reactions  . Naproxen Hives    Pt says she can take ibuprofen  . Sulfonamide Derivatives Hives    Antimicrobials this admission: 7/26 Remdesivir >> 7/30 7/31 Vancomycin >> 7/31 Cefepime >>  Dose adjustments this admission: --  Microbiology results: 7/25 BCx: NGF 7/25 MRSA PCR: positive  7/31 Trach aspirate:   Thank you for allowing pharmacy to be a part of this patient's care.   8/25, PharmD, BCPS Clinical Pharmacist  05/07/2020 12:41 PM

## 2020-05-07 NOTE — Progress Notes (Addendum)
eLink Physician-Brief Progress Note Patient Name: Donna Campbell DOB: 11/14/1960 MRN: [id number]   Date of Service  05/07/2020  HPI/Events of Note  ABG on 60%/PRVC 32/TV 330/P 14 = 7.302/61.1/77.4. P/F ratio = 129.   eICU Interventions  P/F ratio < 150, therefore, will require proning tonight.      Intervention Category Major Interventions: Respiratory failure - evaluation and management  Sharief Wainwright Dennard Nip 05/07/2020, 11:33 PM

## 2020-05-07 NOTE — Progress Notes (Signed)
Sent ordered sputum sample to Lab- RN aware.

## 2020-05-07 NOTE — Progress Notes (Signed)
NAME:  Donna Campbell, MRN:  [id number], DOB:  01/03/1961, LOS: 6 ADMISSION DATE:  05/05/20, CONSULTATION DATE: 05-05-20 REFERRING MD: EDP, CHIEF COMPLAINT: Dyspnea  Brief History   59 year old female presented to the [facility] long emergency room with dyspnea on 05-May-2020.  She had been diagnosed with COVID on April 26, 2020 at an urgent care.  She was intubated in the evening of July 25.  Past Medical History  GERD Asthma  Significant Hospital Events   July 25 admission/intubation, prone, paralyzed  Consults:    Procedures:  ETT 7/25 >> Left IJ CVL 7/25 >>  Significant Diagnostic Tests:  7/27 Echo > LVEF 45-50%, grade 1 diastolic dystolic, RV size/function normal, valves OK  Micro Data:  July 20 SARS-CoV-2 positive 7/31 resp >>  Antimicrobials/COVID treatment  July 25 remdesivir>  July 25 Tocilizumab> July 26 July 25 Solu-Medrol 60 mg every 12 hours>  7/31 cefepime >> 7/31 vanc >>  Interim history/subjective:  Critically ill, intubated In prone position. Good urine output with Lasix Sedated on Versed and Dilaudid On 70%/PEEP of 14 Low-grade febrile   Objective   Blood pressure (!) 165/95, pulse 104, temperature 99.5 F (37.5 C), resp. rate (!) 35, height 5\' 4"  (1.626 m), weight (!) 98.9 kg, last menstrual period 07/17/2012, SpO2 95 %. CVP:  [7 mmHg-17 mmHg] 7 mmHg  Vent Mode: PRVC FiO2 (%):  [60 %-80 %] 60 % Set Rate:  [32 bmp-35 bmp] 32 bmp Vt Set:  [330 mL] 330 mL PEEP:  [12 cmH20-14 cmH20] 14 cmH20 Plateau Pressure:  [22 cmH20-25 cmH20] 25 cmH20   Intake/Output Summary (Last 24 hours) at 05/07/2020 1215 Last data filed at 05/07/2020 1000 Gross per 24 hour  Intake 1757.77 ml  Output 2300 ml  Net -542.23 ml   Filed Weights   05/05/20 0500 05/06/20 0352 05/07/20 0500  Weight: (!) 102.1 kg (!) 98.9 kg (!) 98.9 kg    Examination:  General:  In bed on vent, prone position HENT: NCAT ETT in place PULM: Distant heart sounds B, vent supported  breathing , plateau 22, peak pressure 29, high auto PEEP on vent waveforms CV: cannot examine due to prone position GI: BS+, soft, nontender MSK: normal bulk and tone Neuro: sedated on vent, RASS -4  Chest x-ray 7/31 personally reviewed which shows slight worsening of bilateral airspace disease Labs show mild hypernatremia, hyperglycemia, increasing BUN, stable creatinine , no leukocytosis, stable anemia  Resolved Hospital Problem list     Assessment & Plan:  ARDS due to COVID 19 pneumonia Continue mechanical ventilation per ARDS protocol At Target TVol 6-8cc/kgIBW At Target Plateau Pressure < 30cm H20 At Target driving pressure less than 15 cm of water Target PaO2 55-65: titrate PEEP/FiO2 per protocol P/F ratio remains low, will continue prone position for 16 hours alternating with supine for 8 hours Check supine ABG after 6 hours to decide need for prone again Ventilator associated pneumonia prevention protocol 7/31 lower respiratory rate to 32 to avoid auto PEEP, permit hypercarbia based on ABG, lower FiO2 as she recruits   Vent Dyssynchrony, severe  Need for sedation for mechanical ventilation Continue neuromuscular blockade protocol with as needed vecuronium, frequent pushes required, then transition to drip Continue oxycodone and clonazepam Continue dilaudid, versed , goal RASS -5  Hyperglycemia : still poorly controlled Continue levemir 25 every 12 Continue SSI   AKI> increasing BUN but stable creatinine Monitor BMET and UOP Replace electrolytes as needed CVP higher than goal 4, Lasix  40 IV daily for negative balance  Fever 7/31 with  worsening chest x-ray -Obtain respiratory culture, add empiric cefepime/Vanco  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% Tele Supportive care   Goals of care: remains full code   Best practice:  Diet: tube feeding per protocol Pain/Anxiety/Delirium protocol (if indicated): as above VAP protocol (if  indicated): yes DVT prophylaxis: lovenox GI prophylaxis: Pantoprazole for stress ulcer prophylaxis Glucose control: as above Mobility: bed rest Code Status: full Family Communication: updated husband by phone on 7/31 Disposition: ICU  Labs   CBC: Recent Labs  Lab 05/02/20 0300 05/03/20 0338 05/04/20 0348 05/05/20 0400 05/06/20 0510  WBC 6.1 4.9 5.6 8.0 8.4  NEUTROABS 5.3 4.2 4.9 6.7 6.8  HGB 12.9 11.9* 10.7* 10.6* 10.9*  HCT 40.1 37.1 33.9* 33.1* 35.1*  MCV 91.8 93.0 92.6 93.2 94.1  PLT 272 235 216 197 204    Basic Metabolic Panel: Recent Labs  Lab 05/03/20 0338 05/04/20 0348 05/05/20 0400 05/06/20 0510 05/07/20 0459  NA 139 142 146* 148* 148*  K 4.2 4.6 4.8 4.6 5.3*  CL 105 109 109 109 108  CO2 23 23 29 30 30   GLUCOSE 227* 222* 227* 255* 263*  BUN 47* 68* 78* 95* 78*  CREATININE 1.47* 1.73* 1.68* 1.52* 1.56*  CALCIUM 8.3* 8.5* 9.0 9.2 9.3  MG 2.3 2.6* 3.1* 3.2* 3.1*  PHOS 4.6 2.8 4.1 4.5 5.2*   GFR: Estimated Creatinine Clearance: 44.9 mL/min (A) (by C-G formula based on SCr of 1.56 mg/dL (H)). Recent Labs  Lab 08-May-2020 1610 05/02/20 0300 05/03/20 0338 05/04/20 0348 05/05/20 0400 05/06/20 0510  PROCALCITON 0.14  --   --   --   --   --   WBC 9.7   < > 4.9 5.6 8.0 8.4   < > = values in this interval not displayed.    Liver Function Tests: Recent Labs  Lab 05/02/20 0300 05/03/20 0338 05/04/20 0348 05/05/20 0400 05/06/20 0510  AST 102* 70* 42* 30 36  ALT 69* 73* 60* 50* 53*  ALKPHOS 79 82 72 73 64  BILITOT 0.6 0.4 0.3 0.4 0.4  PROT 7.3 6.6 6.2* 6.3* 6.2*  ALBUMIN 3.3* 2.8* 2.9* 3.0* 3.1*   No results for input(s): LIPASE, AMYLASE in the last 168 hours. No results for input(s): AMMONIA in the last 168 hours.  ABG    Component Value Date/Time   PHART 7.323 (L) 05/06/2020 1600   PCO2ART 58.2 (H) 05/06/2020 1600   PO2ART 71.8 (L) 05/06/2020 1600   HCO3 29.3 (H) 05/06/2020 1600   ACIDBASEDEF 2.0 05/04/2020 0417   O2SAT 93.2 05/06/2020 1600      Coagulation Profile: No results for input(s): INR, PROTIME in the last 168 hours.  Cardiac Enzymes: No results for input(s): CKTOTAL, CKMB, CKMBINDEX, TROPONINI in the last 168 hours.  HbA1C: No results found for: HGBA1C  CBG: Recent Labs  Lab 05/06/20 2003 05/07/20 0004 05/07/20 0318 05/07/20 0841 05/07/20 0843  GLUCAP 223* 198* 224* 51* 228*     Critical care time: 35 minutes    05/09/20 MD. FCCP. [facility]  If no response to pager , please call [phone]   05/07/2020

## 2020-05-08 ENCOUNTER — Inpatient Hospital Stay (HOSPITAL_COMMUNITY): Payer: BC Managed Care – PPO

## 2020-05-08 DIAGNOSIS — Y95 Nosocomial condition: Secondary | ICD-10-CM

## 2020-05-08 DIAGNOSIS — J189 Pneumonia, unspecified organism: Secondary | ICD-10-CM

## 2020-05-08 LAB — CBC
HCT: 37.3 % (ref 36.0–46.0)
Hemoglobin: 11.4 g/dL — ABNORMAL LOW (ref 12.0–15.0)
MCH: 29.8 pg (ref 26.0–34.0)
MCHC: 30.6 g/dL (ref 30.0–36.0)
MCV: 97.4 fL (ref 80.0–100.0)
Platelets: 187 10*3/uL (ref 150–400)
RBC: 3.83 MIL/uL — ABNORMAL LOW (ref 3.87–5.11)
RDW: 14.1 % (ref 11.5–15.5)
WBC: 12.8 10*3/uL — ABNORMAL HIGH (ref 4.0–10.5)
nRBC: 0.3 % — ABNORMAL HIGH (ref 0.0–0.2)

## 2020-05-08 LAB — BASIC METABOLIC PANEL
Anion gap: 10 (ref 5–15)
BUN: 87 mg/dL — ABNORMAL HIGH (ref 6–20)
CO2: 31 mmol/L (ref 22–32)
Calcium: 9.1 mg/dL (ref 8.9–10.3)
Chloride: 110 mmol/L (ref 98–111)
Creatinine, Ser: 1.4 mg/dL — ABNORMAL HIGH (ref 0.44–1.00)
GFR calc Af Amer: 48 mL/min — ABNORMAL LOW (ref >60)
GFR calc non Af Amer: 41 mL/min — ABNORMAL LOW (ref >60)
Glucose, Bld: 230 mg/dL — ABNORMAL HIGH (ref 70–99)
Potassium: 5.7 mmol/L — ABNORMAL HIGH (ref 3.5–5.1)
Sodium: 151 mmol/L — ABNORMAL HIGH (ref 135–145)

## 2020-05-08 LAB — GLUCOSE, CAPILLARY
Glucose-Capillary: 133 mg/dL — ABNORMAL HIGH (ref 70–99)
Glucose-Capillary: 138 mg/dL — ABNORMAL HIGH (ref 70–99)
Glucose-Capillary: 145 mg/dL — ABNORMAL HIGH (ref 70–99)
Glucose-Capillary: 162 mg/dL — ABNORMAL HIGH (ref 70–99)
Glucose-Capillary: 169 mg/dL — ABNORMAL HIGH (ref 70–99)
Glucose-Capillary: 179 mg/dL — ABNORMAL HIGH (ref 70–99)
Glucose-Capillary: 207 mg/dL — ABNORMAL HIGH (ref 70–99)

## 2020-05-08 LAB — PHOSPHORUS: Phosphorus: 5.3 mg/dL — ABNORMAL HIGH (ref 2.5–4.6)

## 2020-05-08 LAB — MAGNESIUM: Magnesium: 2.9 mg/dL — ABNORMAL HIGH (ref 1.7–2.4)

## 2020-05-08 MED ORDER — SODIUM ZIRCONIUM CYCLOSILICATE 10 G PO PACK
10.0000 g | PACK | Freq: Once | ORAL | Status: AC
  Administered 2020-05-08: 10 g
  Filled 2020-05-08: qty 1

## 2020-05-08 MED ORDER — CHLORHEXIDINE GLUCONATE CLOTH 2 % EX PADS
6.0000 | MEDICATED_PAD | Freq: Every day | CUTANEOUS | Status: AC
  Administered 2020-05-09 – 2020-05-12 (×4): 6 via TOPICAL

## 2020-05-08 MED ORDER — STERILE WATER FOR INJECTION IJ SOLN
INTRAMUSCULAR | Status: AC
  Filled 2020-05-08: qty 10

## 2020-05-08 MED ORDER — MUPIROCIN 2 % EX OINT
1.0000 "application " | TOPICAL_OINTMENT | Freq: Two times a day (BID) | CUTANEOUS | Status: AC
  Administered 2020-05-08 – 2020-05-12 (×10): 1 via NASAL
  Filled 2020-05-08 (×2): qty 22

## 2020-05-08 NOTE — Progress Notes (Signed)
NAME:  Donna Campbell, MRN:  [id number], DOB:  11/01/1960, LOS: 7 ADMISSION DATE:  05/06/20, CONSULTATION DATE: 05/06/2020 REFERRING MD: EDP, CHIEF COMPLAINT: Dyspnea  Brief History   59 year old female presented to the [facility] long emergency room with dyspnea on 05-06-2020.  She had been diagnosed with COVID on April 26, 2020 at an urgent care.  She was intubated in the evening of July 25.  Past Medical History  GERD Asthma  Significant Hospital Events   July 25 admission/intubation, prone, paralyzed 7/31 low-grade fever, broad-spectrum antibiotics  Consults:    Procedures:  ETT 7/25 >> Left IJ CVL 7/25 >>  Significant Diagnostic Tests:  7/27 Echo > LVEF 45-50%, grade 1 diastolic dystolic, RV size/function normal, valves OK  Micro Data:  July 20 SARS-CoV-2 positive 7/31 resp >> staph  Antimicrobials/COVID treatment  July 25 remdesivir>  July 25 Tocilizumab> July 26 July 25 Solu-Medrol 60 mg every 12 hours>  7/31 cefepime >> 7/31 vanc >>  Interim history/subjective:  Critically ill, intubated In prone position, around 11 PM Good urine output with Lasix Sedated on Versed and Dilaudid, RASS -4 Consent for tube feed aspiration   Objective   Blood pressure 108/65, pulse 75, temperature 99.9 F (37.7 C), resp. rate (!) 32, height 5\' 4"  (1.626 m), weight (!) 98.9 kg, last menstrual period 07/17/2012, SpO2 97 %. CVP:  [5 mmHg-9 mmHg] 5 mmHg  Vent Mode: PRVC FiO2 (%):  [50 %-60 %] 50 % Set Rate:  [32 bmp] 32 bmp Vt Set:  [330 mL] 330 mL PEEP:  [14 cmH20] 14 cmH20 Plateau Pressure:  [21 cmH20-24 cmH20] 24 cmH20   Intake/Output Summary (Last 24 hours) at 05/08/2020 1129 Last data filed at 05/08/2020 0900 Gross per 24 hour  Intake 1343.04 ml  Output 2300 ml  Net -956.96 ml   Filed Weights   05/05/20 0500 05/06/20 0352 05/07/20 0500  Weight: (!) 102.1 kg (!) 98.9 kg (!) 98.9 kg    Examination:  General:  In bed on vent, prone position HENT: NCAT ETT in  place PULM: Distant heart sounds B, vent supported breathing , plateau 23  CV: cannot examine due to prone position GI: BS+, soft, nontender MSK: normal bulk and tone Neuro: sedated on vent, RASS -4  Chest x-ray 7/31 personally reviewed which shows slight worsening of bilateral airspace disease Labs show mild hypernatremia, persistent hyperkalemia, rising BUN, stable creatinine, decreasing leukocytosis ABG shows partially compensated respiratory acidosis >> acceptable with respiratory rate 32  Resolved Hospital Problem list     Assessment & Plan:  ARDS due to COVID 19 pneumonia Continue mechanical ventilation per ARDS protocol At Target TVol 6-8cc/kgIBW At Target Plateau Pressure < 30cm H20 At Target driving pressure less than 15 cm of water Target PaO2 55-65: titrate PEEP/FiO2 per protocol P/F ratio has improved, will give prone holiday if FiO2 remains at 50 to 60% when supine  Check supine ABG in am  Ventilator associated pneumonia prevention protocol    Vent Dyssynchrony, severe  Need for sedation for mechanical ventilation Continue neuromuscular blockade protocol with as needed vecuronium,  Continue oxycodone and clonazepam Continue dilaudid, versed , goal RASS -5  Fever 7/31 with  worsening chest x-ray HAP/staph aureus, concern for aspiration -Continue empiric cefepime/Vanco, simplify once sensitivities obtained -KUB to check position of NG tube, hold tube feeds while prone and resume when supine  Hyperglycemia : still poorly controlled Continue levemir 25 every 12 Continue SSI   AKI> increasing BUN but stable  creatinine Monitor BMET and UOP Replace electrolytes as needed CVP at 6, hold Lasix today  Hypernatremia -added free water, stable Hyperkalemia -no EKG changes, repeat Lokelma until bowel movement  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 FCCP. [facility]  If no response to pager , please call [phone]   05/08/2020

## 2020-05-08 NOTE — Progress Notes (Signed)
MD Vassie Loll notified of possible aspiration event over night with held tube feeds. Verbal order to resume at 20 mL/hr and increase HOB in reverse trendelenberg. Will continue to monitor for signs of aspiration.

## 2020-05-08 NOTE — Progress Notes (Signed)
MD Vassie Loll notified of possible aspiration of tube feed event after giving medications. Tube feeding turned off and nose/mouth/ETT tube suctioned. Patient remained in high incline reverse trendelenberg position. Verbal order to hold tube feeds and keep NPO until unproned this afternoon. Will continue to monitor.

## 2020-05-08 NOTE — Progress Notes (Signed)
Upon assessment of patient it was noted that tube feed was coming out of mouth. Mouth and et tube suctioned. Tube feed turned off and hooked to suction. of tube feed obtained. elink informed.

## 2020-05-08 DEATH — deceased

## 2020-05-09 ENCOUNTER — Inpatient Hospital Stay (HOSPITAL_COMMUNITY): Payer: BC Managed Care – PPO

## 2020-05-09 LAB — PHOSPHORUS: Phosphorus: 4.5 mg/dL (ref 2.5–4.6)

## 2020-05-09 LAB — GLUCOSE, CAPILLARY
Glucose-Capillary: 162 mg/dL — ABNORMAL HIGH (ref 70–99)
Glucose-Capillary: 166 mg/dL — ABNORMAL HIGH (ref 70–99)
Glucose-Capillary: 182 mg/dL — ABNORMAL HIGH (ref 70–99)
Glucose-Capillary: 185 mg/dL — ABNORMAL HIGH (ref 70–99)
Glucose-Capillary: 194 mg/dL — ABNORMAL HIGH (ref 70–99)
Glucose-Capillary: 200 mg/dL — ABNORMAL HIGH (ref 70–99)

## 2020-05-09 LAB — CBC
HCT: 37.4 % (ref 36.0–46.0)
Hemoglobin: 11.3 g/dL — ABNORMAL LOW (ref 12.0–15.0)
MCH: 29.5 pg (ref 26.0–34.0)
MCHC: 30.2 g/dL (ref 30.0–36.0)
MCV: 97.7 fL (ref 80.0–100.0)
Platelets: 171 10*3/uL (ref 150–400)
RBC: 3.83 MIL/uL — ABNORMAL LOW (ref 3.87–5.11)
RDW: 14.1 % (ref 11.5–15.5)
WBC: 11.7 10*3/uL — ABNORMAL HIGH (ref 4.0–10.5)
nRBC: 0.3 % — ABNORMAL HIGH (ref 0.0–0.2)

## 2020-05-09 LAB — BASIC METABOLIC PANEL
Anion gap: 12 (ref 5–15)
BUN: 96 mg/dL — ABNORMAL HIGH (ref 6–20)
CO2: 28 mmol/L (ref 22–32)
Calcium: 9.1 mg/dL (ref 8.9–10.3)
Chloride: 113 mmol/L — ABNORMAL HIGH (ref 98–111)
Creatinine, Ser: 1.36 mg/dL — ABNORMAL HIGH (ref 0.44–1.00)
GFR calc Af Amer: 50 mL/min — ABNORMAL LOW (ref >60)
GFR calc non Af Amer: 43 mL/min — ABNORMAL LOW (ref >60)
Glucose, Bld: 197 mg/dL — ABNORMAL HIGH (ref 70–99)
Potassium: 5.3 mmol/L — ABNORMAL HIGH (ref 3.5–5.1)
Sodium: 153 mmol/L — ABNORMAL HIGH (ref 135–145)

## 2020-05-09 LAB — BLOOD GAS, ARTERIAL
Acid-Base Excess: 3.9 mmol/L — ABNORMAL HIGH (ref 0.0–2.0)
Bicarbonate: 30.2 mmol/L — ABNORMAL HIGH (ref 20.0–28.0)
FIO2: 50
MECHVT: 330 mL
O2 Saturation: 90.9 %
PEEP: 14 cmH2O
Patient temperature: 99.5
RATE: 32 resp/min
pCO2 arterial: 57.6 mmHg — ABNORMAL HIGH (ref 32.0–48.0)
pH, Arterial: 7.342 — ABNORMAL LOW (ref 7.350–7.450)
pO2, Arterial: 69.4 mmHg — ABNORMAL LOW (ref 83.0–108.0)

## 2020-05-09 LAB — MAGNESIUM: Magnesium: 3 mg/dL — ABNORMAL HIGH (ref 1.7–2.4)

## 2020-05-09 MED ORDER — NEPRO/CARBSTEADY PO LIQD
1000.0000 mL | ORAL | Status: DC
  Filled 2020-05-09: qty 1000

## 2020-05-09 MED ORDER — RENA-VITE PO TABS
1.0000 | ORAL_TABLET | Freq: Every day | ORAL | Status: DC
  Administered 2020-05-09 – 2020-05-15 (×6): 1
  Filled 2020-05-09 (×8): qty 1

## 2020-05-09 MED ORDER — PROSOURCE TF PO LIQD
90.0000 mL | Freq: Three times a day (TID) | ORAL | Status: DC
  Administered 2020-05-09 – 2020-05-11 (×6): 90 mL
  Filled 2020-05-09 (×7): qty 90

## 2020-05-09 MED ORDER — FREE WATER
300.0000 mL | Status: DC
  Administered 2020-05-09 – 2020-05-14 (×34): 300 mL

## 2020-05-09 MED ORDER — NEPRO/CARBSTEADY PO LIQD
1000.0000 mL | ORAL | Status: DC
  Administered 2020-05-09 – 2020-05-10 (×2): 1000 mL
  Filled 2020-05-09 (×2): qty 1000

## 2020-05-09 NOTE — Progress Notes (Signed)
NUTRITION NOTE  Patient discussed in rounds. Plan is for no proning today, try to meet nutrition needs. Patient has aspirated 3 times.   Will adjust new TF regimen to be Nepro @ 30 ml/hr with 90 ml Prosource TF TID. This regimen will provide 1536 kcal (106% estimated kcal need), 124 grams protein, and 523 ml free water.     Estimated Nutritional Needs:  Kcal:  1450 kcal (75% [employer] Equation) Protein:  120-148 grams (1.2-1.5 grams/kg actual weight) Fluid:  >2L/day    Trenton Gammon, MS, RD, LDN, CNSC Inpatient Clinical Dietitian RD pager # available in [facility]  After hours/weekend pager # available in [facility]

## 2020-05-09 NOTE — Progress Notes (Signed)
This RN and another RN laid patient flat for short duration to clean up. RN noticed tube feed coming from patient's nose and mouth. Patient immediately returned to 30 degrees and suctioned. Oxygen saturation dropped to 84% and slowly returned to 89%. MD notified, verbal ok to resume tube feeds at current rate.

## 2020-05-09 NOTE — Progress Notes (Signed)
Nutrition Follow-up  DOCUMENTATION CODES:   Obesity unspecified  INTERVENTION:  - will adjust TF regimen: Nepro @ 60 ml/hr x12 hours/day with 90 ml Prosource TF TID and 1 tablet rena-vite/day.  - this regimen will provide 720 ml TF, 1536 kcal (106% estimated kcal need), 124 grams protein, and 523 ml free water.  - free water flush to continue to be per CCM.   NUTRITION DIAGNOSIS:   Increased nutrient needs related to acute illness, catabolic illness ([id number] infection) as evidenced by estimated needs. -ongoing  GOAL:   Provide needs based on ASPEN/SCCM guidelines -to be met with TF regimen  MONITOR:   Vent status, TF tolerance, Labs, Weight trends  REASON FOR ASSESSMENT:   Ventilator  ASSESSMENT:   Pt admitted with severe ARDS, COVID-19 pneumonia. PMH includes asthma, GERD.  Patient remains intubated with OGT in place. She is currently receiving Vital AF 1.2 @ 40 ml/hr with 90 ml Prosource TF BID and 200 ml water every 4 hours. This regimen is providing 1312 kcal, 116 grams protein, and 1898 ml free water.   Weight trending down 7/17-7/30 and has been stable since 7/30. Last weight was on 7/31.   Able to talk with RN about patient prior to entering her room. Plan at this time is to prone for 12 hours/day, supine for 12 hours/day. Patient has been vomiting TF (has even been coming from her nose) when TF as low as 20 ml/hr when prone, so MD would like for all nutrition to be provided over the 12 hours/day that she is supine.   Orders outlined above and change in formula d/t hypermagnesemia and persistent hyperkalemia; K did begin to trend down after lokelma dose yesterday.   Notes indicate that fever began on 7/31 and that CXR showed worsening with concern for recurrent aspiration.   Patient is currently intubated on ventilator support MV: 10.4 L/min Temp (24hrs), Avg:99.6 F (37.6 C), Min:99.1 F (37.3 C), Max:100.2 F (37.9 C) Propofol: none BP: 125/75 and MAP:  90  Labs reviewed; CBGs: 182 and 162 mg/dl, Na: 153 mmol/l, K: 5.3 mmol/l, Cl: 113 mmol/l, BUN: 96 mg/dl, creatinine: 1.36 mg/dl, Mg: 3 mg/dl, GFR: 43 ml/min.  Medications reviewed; 100 mg colace BID, sliding scale novolog, 25 units levemir BID, 60 mg solu-medrol BID, 17 g miralax/day, 5 ml senokot BID, 10 g lokelma x1 dose 8/1.    NUTRITION - FOCUSED PHYSICAL EXAM:  completed; no muscle or fat wasting, mild edema to BLE noted.   Diet Order:   Diet Order            Diet NPO time specified  Diet effective now                 EDUCATION NEEDS:   Not appropriate for education at this time  Skin:  Skin Assessment: Reviewed RN Assessment  Last BM:  8/1 (type 7)  Height:   Ht Readings from Last 1 Encounters:  05/04/20 '5\' 4"'  (1.626 m)    Weight:   Wt Readings from Last 1 Encounters:  05/07/20 (!) 98.9 kg     Estimated Nutritional Needs:  Kcal:  1450 kcal (75% [employer] Equation) Protein:  120-148 grams (1.2-1.5 grams/kg actual weight) Fluid:  >2L/d     Jarome Matin, MS, RD, LDN, CNSC Inpatient Clinical Dietitian RD pager # available in [facility]  After hours/weekend pager # available in [facility]

## 2020-05-09 NOTE — Progress Notes (Signed)
NAME:  Donna Campbell, MRN:  [id number], DOB:  [birth year], LOS: 8 ADMISSION DATE:  05/27/2020, CONSULTATION DATE: 05/27/20 REFERRING MD: EDP, CHIEF COMPLAINT: Dyspnea  Brief History   59 year old female presented to the [facility] long emergency room with dyspnea on May 27, 2020.  She had been diagnosed with COVID on April 26, 2020 at an urgent care.  She was intubated in the evening of July 25.  Past Medical History  GERD Asthma  Significant Hospital Events   July 25 admission/intubation, prone, paralyzed  Consults:    Procedures:  ETT 7/25 Left IJ CVL 7/25  Significant Diagnostic Tests:  7/27 Echo > LVEF 45-50%, grade 1 diastolic dystolic, RV size/function normal, valves OK  Micro Data:  July 20 SARS-CoV-2 positive 7/25 blood > negative 7/31 resp > staph aureus   Antimicrobials/COVID treatment  July 25 remdesivir>  July 25 Tocilizumab> July 26 July 25 Solu-Medrol 60 mg every 12 hours>  7/31 cefepime >> 7/31 vanc >>  Interim history/subjective:   Developed staph aureus pneumonia over weekend Remains on vent Frequent aspiration events whenever in prone position   Objective   Blood pressure (!) 117/53, pulse 69, temperature 99.7 F (37.6 C), resp. rate (!) 32, height 5\' 4"  (1.626 m), weight (!) 98.9 kg, last menstrual period 07/17/2012, SpO2 93 %. CVP:  [4 mmHg-8 mmHg] 4 mmHg  Vent Mode: PRVC FiO2 (%):  [50 %-60 %] 50 % Set Rate:  [32 bmp] 32 bmp Vt Set:  [330 mL] 330 mL PEEP:  [14 cmH20] 14 cmH20 Plateau Pressure:  [24 cmH20-26 cmH20] 24 cmH20   Intake/Output Summary (Last 24 hours) at 05/09/2020 0750 Last data filed at 05/09/2020 0600 Gross per 24 hour  Intake 1058.63 ml  Output 2000 ml  Net -941.37 ml   Filed Weights   05/05/20 0500 05/06/20 0352 05/07/20 0500  Weight: (!) 102.1 kg (!) 98.9 kg (!) 98.9 kg    Examination:  General:  In bed on vent, supine HENT: NCAT ETT in place PULM: Crackles bases B, vent supported breathing CV: RRR, no mgr GI:  BS+, soft, nontender MSK: normal bulk and tone Neuro: sedated on ventilator  July 29 CXR> bilateral airspace disease   Resolved Hospital Problem list     Assessment & Plan:  ARDS due to COVID 19 pneumonia Continue mechanical ventilation per ARDS protocol Target TVol 6-8cc/kgIBW Target Plateau Pressure < 30cm H20 Target driving pressure less than 15 cm of water Target PaO2 55-65: titrate PEEP/FiO2 per protocol As long as PaO2 to FiO2 ratio is less than 1:150 position in prone position for 16 hours a day Check CVP daily if CVL in place Target CVP less than 4, diurese as necessary Ventilator associated pneumonia prevention protocol 8/2 plan: hold prone positioning as she aspirates in the prone position and her P:F ratio is nearing 150, hold lasix today with elevated sodium and BUN  Staph aureus pneumonia  Continue vancomycin and cefepime F/U sensitives from culture  Vent Dyssynchrony, severe  Need for sedation for mechanical ventilation Continue dilaudid, versed per protocol RASS target -3 Continue oxycodone and clonazepam  Hyperglycemia : still poorly controlled Continue detemir and SSI Monitor glucose  AKI> worsening, volume overload, may need to consider HD Hypernatremia > worsening Increase free water via tube Monitor BMET and UOP Replace electrolytes as needed  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% Tele Supportive care  Goals of care: full code   Best practice:  Diet: tube feeding per protocol Pain/Anxiety/Delirium protocol (  if indicated): as above VAP protocol (if indicated): yes DVT prophylaxis: lovenox GI prophylaxis: Pantoprazole for stress ulcer prophylaxis Glucose control: as above Mobility: bed rest Code Status: full Family Communication: updated her husband by phone on 8/2 Disposition: remain in ICU  Labs   CBC: Recent Labs  Lab 05/03/20 0338 05/03/20 0338 05/04/20 0348 05/04/20 0348 05/05/20 0400 05/06/20 0510  05/07/20 1502 05/08/20 0435 05/09/20 0531  WBC 4.9   < > 5.6   < > 8.0 8.4 13.7* 12.8* 11.7*  NEUTROABS 4.2  --  4.9  --  6.7 6.8  --   --   --   HGB 11.9*   < > 10.7*   < > 10.6* 10.9* 11.2* 11.4* 11.3*  HCT 37.1   < > 33.9*   < > 33.1* 35.1* 36.9 37.3 37.4  MCV 93.0   < > 92.6   < > 93.2 94.1 98.7 97.4 97.7  PLT 235   < > 216   < > 197 204 180 187 171   < > = values in this interval not displayed.    Basic Metabolic Panel: Recent Labs  Lab 05/05/20 0400 05/05/20 0400 05/06/20 0510 05/07/20 0459 05/07/20 1502 05/08/20 0435 05/09/20 0531  NA 146*   < > 148* 148* 150* 151* 153*  K 4.8   < > 4.6 5.3* 5.9* 5.7* 5.3*  CL 109   < > 109 108 106 110 113*  CO2 29   < > 30 30 32 31 28  GLUCOSE 227*   < > 255* 263* 236* 230* 197*  BUN 78*   < > 95* 78* 94* 87* 96*  CREATININE 1.68*   < > 1.52* 1.56* 1.62* 1.40* 1.36*  CALCIUM 9.0   < > 9.2 9.3 9.2 9.1 9.1  MG 3.1*  --  3.2* 3.1*  --  2.9* 3.0*  PHOS 4.1  --  4.5 5.2*  --  5.3* 4.5   < > = values in this interval not displayed.   GFR: Estimated Creatinine Clearance: 51.5 mL/min (A) (by C-G formula based on SCr of 1.36 mg/dL (H)). Recent Labs  Lab 05/06/20 0510 05/07/20 1502 05/08/20 0435 05/09/20 0531  WBC 8.4 13.7* 12.8* 11.7*    Liver Function Tests: Recent Labs  Lab 05/03/20 0338 05/04/20 0348 05/05/20 0400 05/06/20 0510  AST 70* 42* 30 36  ALT 73* 60* 50* 53*  ALKPHOS 82 72 73 64  BILITOT 0.4 0.3 0.4 0.4  PROT 6.6 6.2* 6.3* 6.2*  ALBUMIN 2.8* 2.9* 3.0* 3.1*   No results for input(s): LIPASE, AMYLASE in the last 168 hours. No results for input(s): AMMONIA in the last 168 hours.  ABG    Component Value Date/Time   PHART 7.342 (L) 05/09/2020 0520   PCO2ART 57.6 (H) 05/09/2020 0520   PO2ART 69.4 (L) 05/09/2020 0520   HCO3 30.2 (H) 05/09/2020 0520   ACIDBASEDEF 2.0 05/04/2020 0417   O2SAT 90.9 05/09/2020 0520     Coagulation Profile: No results for input(s): INR, PROTIME in the last 168 hours.  Cardiac  Enzymes: No results for input(s): CKTOTAL, CKMB, CKMBINDEX, TROPONINI in the last 168 hours.  HbA1C: No results found for: HGBA1C  CBG: Recent Labs  Lab 05/08/20 1257 05/08/20 1632 05/08/20 2023 05/08/20 2331 05/09/20 0325  GLUCAP 138* 169* 145* 133* 182*     Critical care time: 40 minutes     Heber Ross, MD [place] [facility] Pager: [phone] Cell: [phone] If no response, call [phone]

## 2020-05-10 LAB — BLOOD GAS, ARTERIAL
Acid-Base Excess: 1 mmol/L (ref 0.0–2.0)
Bicarbonate: 27.6 mmol/L (ref 20.0–28.0)
Drawn by: 295031
FIO2: 50
MECHVT: 330 mL
O2 Saturation: 95.3 %
PEEP: 14 cmH2O
Patient temperature: 98.6
RATE: 32 resp/min
pCO2 arterial: 56.2 mmHg — ABNORMAL HIGH (ref 32.0–48.0)
pH, Arterial: 7.312 — ABNORMAL LOW (ref 7.350–7.450)
pO2, Arterial: 83.2 mmHg (ref 83.0–108.0)

## 2020-05-10 LAB — CBC WITH DIFFERENTIAL/PLATELET
Abs Immature Granulocytes: 0.18 10*3/uL — ABNORMAL HIGH (ref 0.00–0.07)
Basophils Absolute: 0 10*3/uL (ref 0.0–0.1)
Basophils Relative: 0 %
Eosinophils Absolute: 0 10*3/uL (ref 0.0–0.5)
Eosinophils Relative: 0 %
HCT: 36.9 % (ref 36.0–46.0)
Hemoglobin: 11.1 g/dL — ABNORMAL LOW (ref 12.0–15.0)
Immature Granulocytes: 2 %
Lymphocytes Relative: 6 %
Lymphs Abs: 0.6 10*3/uL — ABNORMAL LOW (ref 0.7–4.0)
MCH: 30 pg (ref 26.0–34.0)
MCHC: 30.1 g/dL (ref 30.0–36.0)
MCV: 99.7 fL (ref 80.0–100.0)
Monocytes Absolute: 0.5 10*3/uL (ref 0.1–1.0)
Monocytes Relative: 5 %
Neutro Abs: 8.9 10*3/uL — ABNORMAL HIGH (ref 1.7–7.7)
Neutrophils Relative %: 87 %
Platelets: 144 10*3/uL — ABNORMAL LOW (ref 150–400)
RBC: 3.7 MIL/uL — ABNORMAL LOW (ref 3.87–5.11)
RDW: 14.1 % (ref 11.5–15.5)
WBC: 10.2 10*3/uL (ref 4.0–10.5)
nRBC: 0 % (ref 0.0–0.2)

## 2020-05-10 LAB — CULTURE, RESPIRATORY W GRAM STAIN

## 2020-05-10 LAB — GLUCOSE, CAPILLARY
Glucose-Capillary: 187 mg/dL — ABNORMAL HIGH (ref 70–99)
Glucose-Capillary: 187 mg/dL — ABNORMAL HIGH (ref 70–99)
Glucose-Capillary: 162 mg/dL — ABNORMAL HIGH (ref 70–99)
Glucose-Capillary: 162 mg/dL — ABNORMAL HIGH (ref 70–99)
Glucose-Capillary: 162 mg/dL — ABNORMAL HIGH (ref 70–99)
Glucose-Capillary: 187 mg/dL — ABNORMAL HIGH (ref 70–99)
Glucose-Capillary: 193 mg/dL — ABNORMAL HIGH (ref 70–99)
Glucose-Capillary: 152 mg/dL — ABNORMAL HIGH (ref 70–99)

## 2020-05-10 LAB — COMPREHENSIVE METABOLIC PANEL
ALT: 37 U/L (ref 0–44)
AST: 23 U/L (ref 15–41)
Albumin: 3 g/dL — ABNORMAL LOW (ref 3.5–5.0)
Alkaline Phosphatase: 51 U/L (ref 38–126)
Anion gap: 9 (ref 5–15)
BUN: 91 mg/dL — ABNORMAL HIGH (ref 6–20)
CO2: 27 mmol/L (ref 22–32)
Calcium: 8.8 mg/dL — ABNORMAL LOW (ref 8.9–10.3)
Chloride: 112 mmol/L — ABNORMAL HIGH (ref 98–111)
Creatinine, Ser: 1.15 mg/dL — ABNORMAL HIGH (ref 0.44–1.00)
GFR calc Af Amer: >60 mL/min (ref >60)
GFR calc non Af Amer: 52 mL/min — ABNORMAL LOW (ref >60)
Glucose, Bld: 223 mg/dL — ABNORMAL HIGH (ref 70–99)
Potassium: 5.2 mmol/L — ABNORMAL HIGH (ref 3.5–5.1)
Sodium: 148 mmol/L — ABNORMAL HIGH (ref 135–145)
Total Bilirubin: 0.8 mg/dL (ref 0.3–1.2)
Total Protein: 5.9 g/dL — ABNORMAL LOW (ref 6.5–8.1)

## 2020-05-10 MED ORDER — MIDAZOLAM 50MG/50ML (1MG/ML) PREMIX INFUSION
INTRAVENOUS | Status: AC
  Administered 2020-05-10: 8 mg/h via INTRAVENOUS
  Filled 2020-05-10: qty 50

## 2020-05-10 MED ORDER — MIDAZOLAM 50MG/50ML (1MG/ML) PREMIX INFUSION
0.5000 mg/h | INTRAVENOUS | Status: DC
  Administered 2020-05-10: 9 mg/h via INTRAVENOUS
  Administered 2020-05-10 – 2020-05-11 (×3): 8.5 mg/h via INTRAVENOUS
  Administered 2020-05-11: 9 mg/h via INTRAVENOUS
  Administered 2020-05-12: 6.5 mg/h via INTRAVENOUS
  Filled 2020-05-10 (×2): qty 50
  Filled 2020-05-10: qty 100
  Filled 2020-05-10 (×3): qty 50

## 2020-05-10 MED ORDER — NEPRO/CARBSTEADY PO LIQD
1000.0000 mL | ORAL | Status: AC
  Filled 2020-05-10: qty 1000

## 2020-05-10 MED ORDER — VECURONIUM BROMIDE 10 MG IV SOLR
10.0000 mg | Freq: Once | INTRAVENOUS | Status: AC
  Administered 2020-05-10: 10 mg via INTRAVENOUS

## 2020-05-10 MED ORDER — CLONAZEPAM 1 MG PO TABS
1.0000 mg | ORAL_TABLET | Freq: Two times a day (BID) | ORAL | Status: DC
  Administered 2020-05-10 – 2020-05-11 (×3): 1 mg
  Filled 2020-05-10 (×3): qty 1

## 2020-05-10 MED ORDER — METHYLPREDNISOLONE SODIUM SUCC 40 MG IJ SOLR
40.0000 mg | Freq: Every day | INTRAMUSCULAR | Status: DC
  Administered 2020-05-11 – 2020-05-14 (×4): 40 mg via INTRAVENOUS
  Filled 2020-05-10 (×4): qty 1

## 2020-05-10 MED ORDER — CHLORHEXIDINE GLUCONATE 0.12 % MT SOLN
OROMUCOSAL | Status: AC
  Administered 2020-05-10: 15 mL via OROMUCOSAL
  Filled 2020-05-10: qty 15

## 2020-05-10 NOTE — Progress Notes (Signed)
Pharmacy Antibiotic Note  Donna Campbell is a 59 y.o. female admitted on 04/21/2020 with ARDS due to COVID-19 pneumonia. CXR today: worsening pneumonia throughout both lungs, most confluent in the right lower lobe. Pharmacy consulted for Vancomycin and Cefepime dosing on 7/31  Day 4 Abxs WBC 10.2 trending down SCr 1.15 trending down Afebrile Tracheal Aspirate culture growing MRSA  Plan: Continue vancomycin 1500mg  IV q24h Will check a vanc trough tomorrow 8/5 prior to 5th dose Per discussion with CCM, will discontinue cefepime Monitor renal function, cultures, clinical course  Height: 5\' 4"  (162.6 cm) Weight: 100.3 kg (221 lb 1.9 oz) IBW/kg (Calculated) : 54.7  Temp (24hrs), Avg:99.1 F (37.3 C), Min:98.6 F (37 C), Max:99.9 F (37.7 C)  Recent Labs  Lab 05/06/20 0510 05/06/20 0510 05/07/20 0459 05/07/20 1502 05/08/20 0435 05/09/20 0531 05/10/20 0200  WBC 8.4  --   --  13.7* 12.8* 11.7* 10.2  CREATININE 1.52*   < > 1.56* 1.62* 1.40* 1.36* 1.15*   < > = values in this interval not displayed.    Estimated Creatinine Clearance: 61.4 mL/min (A) (by C-G formula based on SCr of 1.15 mg/dL (H)).    Allergies  Allergen Reactions  . Naproxen Hives    Pt says she can take ibuprofen  . Sulfonamide Derivatives Hives    Antimicrobials this admission: 7/26 Remdesivir >> 7/30 7/31 Vancomycin >> 7/31 Cefepime >> 8/3  Dose adjustments this admission: --  Microbiology results: 7/25 BCx: NGF 7/25 MRSA PCR: positive  7/31 Trach aspirate: MRSA  Thank you for allowing pharmacy to be a part of this patient's care.  8/25, PharmD, BCPS 05/10/2020 8:51 AM

## 2020-05-10 NOTE — TOC Progression Note (Signed)
Transition of Care [facility]) - Progression Note    Patient Details  Name: Donna Campbell MRN: [id number] Date of Birth: 1961/08/01  [facility] CM/SW Contact  Golda Acre, RN Phone Number: 05/10/2020, 7:59 AM  Clinical Narrative:    Remains on the vent due to +covid status, Iv solu-medrol,Maxipime,Vancoready, iv sedation with Dilaudid and versed. Following for progression and toc needs.  May need ltach placment due to prolonged time on vent. Intubated on July 25th.   Expected Discharge Plan: Home/Self Care Barriers to Discharge: Continued Medical Work up  Expected Discharge Plan and Services Expected Discharge Plan: Home/Self Care   Discharge Planning Services: CM Consult   Living arrangements for the past 2 months: Single Family Home                                       Social Determinants of Health (SDOH) Interventions    Readmission Risk Interventions No flowsheet data found.

## 2020-05-10 NOTE — Progress Notes (Signed)
NAME:  Donna Campbell, MRN:  [id number], DOB:  July 08, 1961, LOS: 9 ADMISSION DATE:  04/18/2020, CONSULTATION DATE: May 01, 2020 REFERRING MD: EDP, CHIEF COMPLAINT: Dyspnea  Brief History   59 year old female presented to the [facility] long emergency room with dyspnea on May 01, 2020.  She had been diagnosed with COVID on April 26, 2020 at an urgent care.  She was intubated in the evening of July 25.  Past Medical History  GERD Asthma  Significant Hospital Events   July 25 admission/intubation, prone, paralyzed  Consults:    Procedures:  ETT 7/25 Left IJ CVL 7/25  Significant Diagnostic Tests:  7/27 Echo > LVEF 45-50%, grade 1 diastolic dystolic, RV size/function normal, valves OK  Micro Data:  July 20 SARS-CoV-2 positive 7/25 blood > negative 7/31 resp > MRSA   Antimicrobials/COVID treatment  July 25 remdesivir>  July 25 Tocilizumab> July 26 July 25 Solu-Medrol 60 mg every 12 hours>  7/31 cefepime >> 8/2 7/31 vanc >>  Interim history/subjective:   Making urine OK Had two small bowel movements yesterday Remains off vasopressors Hypernatremia improved  Objective   Blood pressure 133/75, pulse 67, temperature 99.1 F (37.3 C), temperature source Bladder, resp. rate (!) 32, height 5\' 4"  (1.626 m), weight 100.3 kg, last menstrual period 07/17/2012, SpO2 96 %.    Vent Mode: PRVC FiO2 (%):  [50 %] 50 % Set Rate:  [32 bmp] 32 bmp Vt Set:  [330 mL] 330 mL PEEP:  [14 cmH20] 14 cmH20 Plateau Pressure:  [24 cmH20-25 cmH20] 24 cmH20   Intake/Output Summary (Last 24 hours) at 05/10/2020 0753 Last data filed at 05/10/2020 07/10/2020 Gross per 24 hour  Intake 3534.13 ml  Output 2110 ml  Net 1424.13 ml   Filed Weights   05/06/20 0352 05/07/20 0500 05/10/20 0232  Weight: (!) 98.9 kg (!) 98.9 kg 100.3 kg    Examination:  General:  In bed on vent HENT: NCAT ETT in place PULM: CTA B, vent supported breathing CV: RRR, no mgr GI: BS+, soft, nontender MSK: normal bulk and  tone Neuro: sedated on vent  July 29 CXR> bilateral airspace disease   Resolved Hospital Problem list     Assessment & Plan:  ARDS due to COVID 19 pneumonia > slow improvement in oxygenation Continue mechanical ventilation per ARDS protocol Target TVol 6-8cc/kgIBW Target Plateau Pressure < 30cm H20 Target driving pressure less than 15 cm of water Target PaO2 55-65: titrate PEEP/FiO2 per protocol As long as PaO2 to FiO2 ratio is less than 1:150 position in prone position for 16 hours a day Check CVP daily if CVL in place Target CVP less than 4, diurese as necessary Ventilator associated pneumonia prevention protocol 8/3 decrease RR and PEEP for comfort, no prone indicated  MRSA pneumonia  Continue vancomycin  Vent Dyssynchrony, severe  Need for sedation for mechanical ventilation continue dilaudid, versed infusions per protocol Decrease clonazepam RASS target -1 to -2  Hyperglycemia :  Detemir and SSI Monitor glucose  AKI> worsening, volume overload, may need to consider HD Hypernatremia > worsening Continue free water via tube Monitor BMET and UOP Replace electrolytes as needed  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% Tele Supportive care  Goals of care: full code   Best practice:  Diet: tube feeding per protocol Pain/Anxiety/Delirium protocol (if indicated): as above VAP protocol (if indicated): yes DVT prophylaxis: lovenox GI prophylaxis: Pantoprazole for stress ulcer prophylaxis Glucose control: as above Mobility: bed rest Code Status: full Family Communication: updated  her husband Richard by [employer] 8/3 Disposition: remain in ICU  Labs   CBC: Recent Labs  Lab 05/04/20 0348 05/04/20 0348 05/05/20 0400 05/05/20 0400 05/06/20 0510 05/07/20 1502 05/08/20 0435 05/09/20 0531 05/10/20 0200  WBC 5.6   < > 8.0   < > 8.4 13.7* 12.8* 11.7* 10.2  NEUTROABS 4.9  --  6.7  --  6.8  --   --   --  8.9*  HGB 10.7*   < > 10.6*   < >  10.9* 11.2* 11.4* 11.3* 11.1*  HCT 33.9*   < > 33.1*   < > 35.1* 36.9 37.3 37.4 36.9  MCV 92.6   < > 93.2   < > 94.1 98.7 97.4 97.7 99.7  PLT 216   < > 197   < > 204 180 187 171 144*   < > = values in this interval not displayed.    Basic Metabolic Panel: Recent Labs  Lab 05/05/20 0400 05/05/20 0400 05/06/20 0510 05/06/20 0510 05/07/20 0459 05/07/20 1502 05/08/20 0435 05/09/20 0531 05/10/20 0200  NA 146*   < > 148*   < > 148* 150* 151* 153* 148*  K 4.8   < > 4.6   < > 5.3* 5.9* 5.7* 5.3* 5.2*  CL 109   < > 109   < > 108 106 110 113* 112*  CO2 29   < > 30   < > 30 32 31 28 27   GLUCOSE 227*   < > 255*   < > 263* 236* 230* 197* 223*  BUN 78*   < > 95*   < > 78* 94* 87* 96* 91*  CREATININE 1.68*   < > 1.52*   < > 1.56* 1.62* 1.40* 1.36* 1.15*  CALCIUM 9.0   < > 9.2   < > 9.3 9.2 9.1 9.1 8.8*  MG 3.1*  --  3.2*  --  3.1*  --  2.9* 3.0*  --   PHOS 4.1  --  4.5  --  5.2*  --  5.3* 4.5  --    < > = values in this interval not displayed.   GFR: Estimated Creatinine Clearance: 61.4 mL/min (A) (by C-G formula based on SCr of 1.15 mg/dL (H)). Recent Labs  Lab 05/07/20 1502 05/08/20 0435 05/09/20 0531 05/10/20 0200  WBC 13.7* 12.8* 11.7* 10.2    Liver Function Tests: Recent Labs  Lab 05/04/20 0348 05/05/20 0400 05/06/20 0510 05/10/20 0200  AST 42* 30 36 23  ALT 60* 50* 53* 37  ALKPHOS 72 73 64 51  BILITOT 0.3 0.4 0.4 0.8  PROT 6.2* 6.3* 6.2* 5.9*  ALBUMIN 2.9* 3.0* 3.1* 3.0*   No results for input(s): LIPASE, AMYLASE in the last 168 hours. No results for input(s): AMMONIA in the last 168 hours.  ABG    Component Value Date/Time   PHART 7.342 (L) 05/09/2020 0520   PCO2ART 57.6 (H) 05/09/2020 0520   PO2ART 69.4 (L) 05/09/2020 0520   HCO3 30.2 (H) 05/09/2020 0520   ACIDBASEDEF 2.0 05/04/2020 0417   O2SAT 90.9 05/09/2020 0520     Coagulation Profile: No results for input(s): INR, PROTIME in the last 168 hours.  Cardiac Enzymes: No results for input(s):  CKTOTAL, CKMB, CKMBINDEX, TROPONINI in the last 168 hours.  HbA1C: No results found for: HGBA1C  CBG: Recent Labs  Lab 05/09/20 1209 05/09/20 1558 05/09/20 1945 05/09/20 2341 05/10/20 0406  GLUCAP 166* 200* 185* 194* 187*     Critical  care time:     Heber , MD [place] [facility] Pager: [phone] Cell: [phone] If no response, call [phone]

## 2020-05-11 ENCOUNTER — Inpatient Hospital Stay (HOSPITAL_COMMUNITY): Payer: BC Managed Care – PPO

## 2020-05-11 LAB — COMPREHENSIVE METABOLIC PANEL
ALT: 36 U/L (ref 0–44)
AST: 38 U/L (ref 15–41)
Albumin: 3.2 g/dL — ABNORMAL LOW (ref 3.5–5.0)
Alkaline Phosphatase: 50 U/L (ref 38–126)
Anion gap: 9 (ref 5–15)
BUN: 83 mg/dL — ABNORMAL HIGH (ref 6–20)
CO2: 28 mmol/L (ref 22–32)
Calcium: 9.8 mg/dL (ref 8.9–10.3)
Chloride: 114 mmol/L — ABNORMAL HIGH (ref 98–111)
Creatinine, Ser: 1.09 mg/dL — ABNORMAL HIGH (ref 0.44–1.00)
GFR calc Af Amer: >60 mL/min (ref >60)
GFR calc non Af Amer: 56 mL/min — ABNORMAL LOW (ref >60)
Glucose, Bld: 104 mg/dL — ABNORMAL HIGH (ref 70–99)
Potassium: 4.5 mmol/L (ref 3.5–5.1)
Sodium: 151 mmol/L — ABNORMAL HIGH (ref 135–145)
Total Bilirubin: 0.8 mg/dL (ref 0.3–1.2)
Total Protein: 6.1 g/dL — ABNORMAL LOW (ref 6.5–8.1)

## 2020-05-11 LAB — CBC WITH DIFFERENTIAL/PLATELET
Abs Immature Granulocytes: 0.07 10*3/uL (ref 0.00–0.07)
Basophils Absolute: 0 10*3/uL (ref 0.0–0.1)
Basophils Relative: 0 %
Eosinophils Absolute: 0.1 10*3/uL (ref 0.0–0.5)
Eosinophils Relative: 1 %
HCT: 37 % (ref 36.0–46.0)
Hemoglobin: 11.3 g/dL — ABNORMAL LOW (ref 12.0–15.0)
Immature Granulocytes: 1 %
Lymphocytes Relative: 12 %
Lymphs Abs: 1 10*3/uL (ref 0.7–4.0)
MCH: 30.3 pg (ref 26.0–34.0)
MCHC: 30.5 g/dL (ref 30.0–36.0)
MCV: 99.2 fL (ref 80.0–100.0)
Monocytes Absolute: 0.3 10*3/uL (ref 0.1–1.0)
Monocytes Relative: 4 %
Neutro Abs: 6.9 10*3/uL (ref 1.7–7.7)
Neutrophils Relative %: 82 %
Platelets: 147 10*3/uL — ABNORMAL LOW (ref 150–400)
RBC: 3.73 MIL/uL — ABNORMAL LOW (ref 3.87–5.11)
RDW: 13.9 % (ref 11.5–15.5)
WBC: 8.3 10*3/uL (ref 4.0–10.5)
nRBC: 0 % (ref 0.0–0.2)

## 2020-05-11 LAB — GLUCOSE, CAPILLARY
Glucose-Capillary: 115 mg/dL — ABNORMAL HIGH (ref 70–99)
Glucose-Capillary: 90 mg/dL (ref 70–99)
Glucose-Capillary: 104 mg/dL — ABNORMAL HIGH (ref 70–99)
Glucose-Capillary: 203 mg/dL — ABNORMAL HIGH (ref 70–99)
Glucose-Capillary: 180 mg/dL — ABNORMAL HIGH (ref 70–99)

## 2020-05-11 LAB — VANCOMYCIN, TROUGH: Vancomycin Tr: 16 ug/mL (ref 15–20)

## 2020-05-11 MED ORDER — PANTOPRAZOLE SODIUM 40 MG PO PACK
40.0000 mg | PACK | Freq: Every day | ORAL | Status: DC
  Administered 2020-05-11 – 2020-05-13 (×3): 40 mg
  Filled 2020-05-11 (×3): qty 20

## 2020-05-11 MED ORDER — VITAL AF 1.2 CAL PO LIQD
1000.0000 mL | ORAL | Status: DC
  Administered 2020-05-11 – 2020-05-12 (×2): 1000 mL

## 2020-05-11 MED ORDER — PROSOURCE TF PO LIQD
45.0000 mL | Freq: Every day | ORAL | Status: DC
  Administered 2020-05-12 – 2020-05-13 (×2): 45 mL
  Filled 2020-05-11 (×2): qty 45

## 2020-05-11 NOTE — Progress Notes (Signed)
Bedside RN asking for clarification of O2 sat goals. D/w Dr Violet Baldy. We will base goal on Dr Corey Skains note, PaO2 55-65. 05/10/20 PaO2 was 83.2

## 2020-05-11 NOTE — Progress Notes (Signed)
Nutrition Follow-up  DOCUMENTATION CODES:   Obesity unspecified  INTERVENTION:  - will adjust TF regimen: Vital AF 1.2 @ 55 ml/hr with 45 ml Prosource TF once/day. - this regimen will provide 1704 kcal (97% estimated kcal need), 132 grams protein, and 1070 ml free water.  - free water flush to continue to be per CCM.    NUTRITION DIAGNOSIS:   Increased nutrient needs related to acute illness, catabolic illness (TIRWE-31 infection) as evidenced by estimated needs. -ongoing  GOAL:   Provide needs based on ASPEN/SCCM guidelines -to be met with TF regimen  MONITOR:   Vent status, TF tolerance, Labs, Weight trends  ASSESSMENT:   Pt admitted with severe ARDS, COVID-19 pneumonia. PMH includes asthma, GERD.  Significant Events: 7/25- admission; intubation and OGT placement; initial RD assessment; TF initiation  8/2- patient assessed at bedside by RD--NFPE completed   Patient discussed in rounds this AM. Plan is to remove OGT and charge RN to place Panda tube (hopeful for post-pyloric placement). Patient is not proning.   Able to talk with RN who reports patient is currently tolerating TF without issue. She is receiving Nepro @ 30 ml/hr with 90 ml Prosource TF TID and 300 ml free water every 3 hours. This regimen is providing 1536 kcal, 124 grams protein, and 2923 ml free water.   Mild generalized edema noted in flow sheet. Weight trending up; used admission weight of 101.7 kg to re-estimate needs.     Patient is currently intubated on ventilator support MV: 8.6 L/min Temp (24hrs), Avg:98.1 F (36.7 C), Min:97.2 F (36.2 C), Max:98.6 F (37 C) Propofol: none BP: 133/60 and MAP: 84  Labs reviewed; CBGs: 115, 90, 104 mg/dl, Na: 151 mmol/l, Cl: 114 mmol/l, BUN: 83 mg/dl, creatinine: 1.09 mg/dl, GFR: 56 ml/min. Medications reviewed; 100 mg colace BID, sliding scale novolog, 25 units levemir BID, 40 mg solu-medrol/day, 1 tablet rena-vit/day, 40 mg protonix per tube/day, 17 g  miralax/day, 5 ml senokot BID.  Drip; versed @ 9 mg/hr.     Diet Order:   Diet Order            Diet NPO time specified  Diet effective now                 EDUCATION NEEDS:   Not appropriate for education at this time  Skin:  Skin Assessment: Reviewed RN Assessment  Last BM:  8/3 (type 7 x2 at start of night shift)  Height:   Ht Readings from Last 1 Encounters:  05/04/20 _0  (1.626 m)    Weight:   Wt Readings from Last 1 Encounters:  05/11/20 104.9 kg     Estimated Nutritional Needs:  Kcal:  1756 kcal ([employer] Equation) Protein:  122-152 grams (1.2-1.5 grams/kg actual wt) Fluid:  >/= 2.5 L/day     Jarome Matin, MS, RD, LDN, CNSC Inpatient Clinical Dietitian RD pager # available in [place]  After hours/weekend pager # available in [facility]

## 2020-05-11 NOTE — Progress Notes (Addendum)
eLink Physician-Brief Progress Note Patient Name: Donna Campbell DOB: 30-Jun-1961 MRN: [id number]   Date of Service  05/11/2020  HPI/Events of Note  RN called to let us know that the right arm looks a little paler as compared to the left, and she felt pulsations were feeble. However, pulses could be heard on doppler. NO change in hemodynamics, does not have pressors infusing and RN not reporting edema specific to this arm, says all limbs have edema . ON camera, patient is deeply sedated on 60%/peep 10 vent.   eICU Interventions  Continue to assess by doppler and monitor for changes In addition, asked RN to lower down sedation and we will assess for pain/movement - at this time she is too sedated to have an assessment done RN also requested an order to change foley since current one is leaking  Have asked RN to let me know examination when sedation is lowered and will then have bedside team evaluate if in fact deficits noted. Doppler pulses being felt is reassuring.      Intervention Category Major Interventions: Other:  Oretha Milch 05/11/2020, 8:39 PM   Addendum Also asked RN to move the BP cuff to the left arm to avoid compression.   10:45 pm - Did a camera assessment Spoke with RN Stable arm color, no changes Strong pulses on doppler, brachial, radial as well as ulnar  RN felt the outer part of the forearm was cooler than inner, but no other changes noted Patient still heavily sedated since RN was bathing her, this is going to be weaned now and arm movement tested Asked RN to let me know if anything is abnormal or changes

## 2020-05-11 NOTE — Progress Notes (Signed)
Pharmacy Antibiotic Note  Donna Campbell is a 59 y.o. female admitted on May 15, 2020 with ARDS due to COVID-19 pneumonia. CXR today: worsening pneumonia throughout both lungs, most confluent in the right lower lobe. Pharmacy consulted for Vancomycin and Cefepime dosing on 7/31  Day 5 VAnc WBC 8.3 trending down SCr 1.09 trending down Afebrile Vanc Trough 16 Tracheal Aspirate culture growing MRSA  Plan: Vanc trough therapeutic (goal 15-20) - continue vancomycin 1500mg  IV q24h Monitor renal function, cultures, clinical course  Height: 5\' 4"  (162.6 cm) Weight: 104.9 kg (231 lb 4.2 oz) IBW/kg (Calculated) : 54.7  Temp (24hrs), Avg:98.1 F (36.7 C), Min:97.2 F (36.2 C), Max:98.6 F (37 C)  Recent Labs  Lab 05/07/20 1502 05/08/20 0435 05/09/20 0531 05/10/20 0200 05/11/20 0424 05/11/20 1151  WBC 13.7* 12.8* 11.7* 10.2 8.3  --   CREATININE 1.62* 1.40* 1.36* 1.15* 1.09*  --   VANCOTROUGH  --   --   --   --   --  16    Estimated Creatinine Clearance: 66.4 mL/min (A) (by C-G formula based on SCr of 1.09 mg/dL (H)).    Allergies  Allergen Reactions  . Naproxen Hives    Pt says she can take ibuprofen  . Sulfonamide Derivatives Hives    Antimicrobials this admission: 7/26 Remdesivir >> 7/30 7/31 Vancomycin >> 7/31 Cefepime >> 8/3  Dose adjustments this admission: --  Microbiology results: 7/25 BCx: NGF 7/25 MRSA PCR: positive  7/31 Trach aspirate: MRSA  Thank you for allowing pharmacy to be a part of this patient's care.  8/25, PharmD, BCPS 05/11/2020 1:53 PM

## 2020-05-11 NOTE — Progress Notes (Signed)
NAME:  Donna Campbell, MRN:  [id number], DOB:  [birth year], LOS: 10 ADMISSION DATE:  04/13/2020, CONSULTATION DATE: May 01, 2020 REFERRING MD: EDP, CHIEF COMPLAINT: Dyspnea  Brief History   59 year old female presented to the [facility] long emergency room with dyspnea on May 01, 2020.  She had been diagnosed with COVID on April 26, 2020 at an urgent care.  She was intubated in the evening of July 25.  Past Medical History  GERD Asthma  Significant Hospital Events   July 25 admission/intubation, prone, paralyzed  Consults:    Procedures:  ETT 7/25 Left IJ CVL 7/25  Significant Diagnostic Tests:  7/27 Echo > LVEF 45-50%, grade 1 diastolic dystolic, RV size/function normal, valves OK  Micro Data:  July 20 SARS-CoV-2 positive 7/25 blood > negative 7/31 resp > MRSA   Antimicrobials/COVID treatment  July 25 remdesivir>  July 25 Tocilizumab> July 26 July 25 Solu-Medrol 60 mg every 12 hours>  7/31 cefepime >> 8/2 7/31 vanc >>  Interim history/subjective:   Some ventilator dyssynchrony this morning No major changes overnight  Objective   Blood pressure 107/81, pulse 82, temperature (!) 97.3 F (36.3 C), resp. rate 18, height 5\' 4"  (1.626 m), weight 104.9 kg, last menstrual period 07/17/2012, SpO2 90 %. CVP:  [8 mmHg] 8 mmHg  Vent Mode: PRVC FiO2 (%):  [50 %-60 %] 60 % Set Rate:  [26 bmp-32 bmp] 26 bmp Vt Set:  [330 mL] 330 mL PEEP:  [10 cmH20-14 cmH20] 10 cmH20 Plateau Pressure:  [15 cmH20-25 cmH20] 15 cmH20   Intake/Output Summary (Last 24 hours) at 05/11/2020 0747 Last data filed at 05/11/2020 07/11/2020 Gross per 24 hour  Intake 1358.2 ml  Output 1175 ml  Net 183.2 ml   Filed Weights   05/07/20 0500 05/10/20 0232 05/11/20 0418  Weight: (!) 98.9 kg 100.3 kg 104.9 kg    Examination:  General:  In bed on vent HENT: NCAT ETT in place PULM: CTA B, vent supported breathing CV: RRR, no mgr GI: BS+, soft, nontender MSK: normal bulk and tone Neuro: sedated on  vent   Aug 4 CXR > independently reviewed, ett in place bibasilar airspace disease  Resolved Hospital Problem list     Assessment & Plan:    ARDS due to COVID 19 pneumonia > slow improvement in oxygenation, 8/4 dyssynchrony Continue mechanical ventilation per ARDS protocol Target TVol 6-8cc/kgIBW Target Plateau Pressure < 30cm H20 Target driving pressure less than 15 cm of water Target PaO2 55-65: titrate PEEP/FiO2 per protocol As long as PaO2 to FiO2 ratio is less than 1:150 position in prone position for 16 hours a day Check CVP daily if CVL in place Target CVP less than 4, diurese as necessary Ventilator associated pneumonia prevention protocol 8/3 change to pressure control for better synchrony, hold lasix again with rising sodium  MRSA pneumonia  Continue vancomycin  Vent Dyssynchrony, severe  Need for sedation for mechanical ventilation Continue diluadid, versed infusions per protocol Continue clonazepam RASS target remains -1 to -2  Hyperglycemia :  Detemir and SSI Monitor glucose  AKI Hypernatremia > worsening again on 8/4 Monitor BMET and UOP Replace electrolytes as needed Continue free water  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% Tele Supportive care  Goals of care: full code   Best practice:  Diet: tube feeding per protocol Pain/Anxiety/Delirium protocol (if indicated): as above VAP protocol (if indicated): yes DVT prophylaxis: lovenox GI prophylaxis: Pantoprazole for stress ulcer prophylaxis Glucose control: as above Mobility: bed rest  Code Status: full Family Communication: Husband Richard updated by phone on 8/4 Disposition: remain in ICU  Labs   CBC: Recent Labs  Lab 05/05/20 0400 05/05/20 0400 05/06/20 0510 05/06/20 0510 05/07/20 1502 05/08/20 0435 05/09/20 0531 05/10/20 0200 05/11/20 0424  WBC 8.0   < > 8.4   < > 13.7* 12.8* 11.7* 10.2 8.3  NEUTROABS 6.7  --  6.8  --   --   --   --  8.9* 6.9  HGB  10.6*   < > 10.9*   < > 11.2* 11.4* 11.3* 11.1* 11.3*  HCT 33.1*   < > 35.1*   < > 36.9 37.3 37.4 36.9 37.0  MCV 93.2   < > 94.1   < > 98.7 97.4 97.7 99.7 99.2  PLT 197   < > 204   < > 180 187 171 144* 147*   < > = values in this interval not displayed.    Basic Metabolic Panel: Recent Labs  Lab 05/05/20 0400 05/05/20 0400 05/06/20 0510 05/06/20 0510 05/07/20 0459 05/07/20 0459 05/07/20 1502 05/08/20 0435 05/09/20 0531 05/10/20 0200 05/11/20 0424  NA 146*   < > 148*   < > 148*   < > 150* 151* 153* 148* 151*  K 4.8   < > 4.6   < > 5.3*   < > 5.9* 5.7* 5.3* 5.2* 4.5  CL 109   < > 109   < > 108   < > 106 110 113* 112* 114*  CO2 29   < > 30   < > 30   < > 32 31 28 27 28   GLUCOSE 227*   < > 255*   < > 263*   < > 236* 230* 197* 223* 104*  BUN 78*   < > 95*   < > 78*   < > 94* 87* 96* 91* 83*  CREATININE 1.68*   < > 1.52*   < > 1.56*   < > 1.62* 1.40* 1.36* 1.15* 1.09*  CALCIUM 9.0   < > 9.2   < > 9.3   < > 9.2 9.1 9.1 8.8* 9.8  MG 3.1*  --  3.2*  --  3.1*  --   --  2.9* 3.0*  --   --   PHOS 4.1  --  4.5  --  5.2*  --   --  5.3* 4.5  --   --    < > = values in this interval not displayed.   GFR: Estimated Creatinine Clearance: 66.4 mL/min (A) (by C-G formula based on SCr of 1.09 mg/dL (H)). Recent Labs  Lab 05/08/20 0435 05/09/20 0531 05/10/20 0200 05/11/20 0424  WBC 12.8* 11.7* 10.2 8.3    Liver Function Tests: Recent Labs  Lab 05/05/20 0400 05/06/20 0510 05/10/20 0200 05/11/20 0424  AST 30 36 23 38  ALT 50* 53* 37 36  ALKPHOS 73 64 51 50  BILITOT 0.4 0.4 0.8 0.8  PROT 6.3* 6.2* 5.9* 6.1*  ALBUMIN 3.0* 3.1* 3.0* 3.2*   No results for input(s): LIPASE, AMYLASE in the last 168 hours. No results for input(s): AMMONIA in the last 168 hours.  ABG    Component Value Date/Time   PHART 7.312 (L) 05/10/2020 0935   PCO2ART 56.2 (H) 05/10/2020 0935   PO2ART 83.2 05/10/2020 0935   HCO3 27.6 05/10/2020 0935   ACIDBASEDEF 2.0 05/04/2020 0417   O2SAT 95.3 05/10/2020  0935     Coagulation Profile: No results for  input(s): INR, PROTIME in the last 168 hours.  Cardiac Enzymes: No results for input(s): CKTOTAL, CKMB, CKMBINDEX, TROPONINI in the last 168 hours.  HbA1C: No results found for: HGBA1C  CBG: Recent Labs  Lab 05/10/20 1153 05/10/20 1521 05/10/20 1951 05/10/20 2316 05/11/20 0311  GLUCAP 162* 187* 193* 152* 115*     Critical care time: 40 minutes     Heber Casey, MD [place] [facility] Pager: [phone] Cell: [phone] If no response, call [phone]

## 2020-05-12 ENCOUNTER — Inpatient Hospital Stay (HOSPITAL_COMMUNITY): Payer: BC Managed Care – PPO

## 2020-05-12 ENCOUNTER — Encounter (HOSPITAL_COMMUNITY): Payer: BC Managed Care – PPO

## 2020-05-12 DIAGNOSIS — R0600 Dyspnea, unspecified: Secondary | ICD-10-CM

## 2020-05-12 DIAGNOSIS — R001 Bradycardia, unspecified: Secondary | ICD-10-CM

## 2020-05-12 LAB — TROPONIN I (HIGH SENSITIVITY): Troponin I (High Sensitivity): 64 ng/L — ABNORMAL HIGH (ref <18)

## 2020-05-12 LAB — ECHOCARDIOGRAM COMPLETE
Area-P 1/2: 2.32 cm2
Height: 64 in
S' Lateral: 4.7 cm
Weight: 3700.2 oz

## 2020-05-12 LAB — COMPREHENSIVE METABOLIC PANEL
ALT: 40 U/L (ref 0–44)
AST: 47 U/L — ABNORMAL HIGH (ref 15–41)
Albumin: 3 g/dL — ABNORMAL LOW (ref 3.5–5.0)
Alkaline Phosphatase: 49 U/L (ref 38–126)
Anion gap: 3 — ABNORMAL LOW (ref 5–15)
BUN: 73 mg/dL — ABNORMAL HIGH (ref 6–20)
CO2: 29 mmol/L (ref 22–32)
Calcium: 10.1 mg/dL (ref 8.9–10.3)
Chloride: 112 mmol/L — ABNORMAL HIGH (ref 98–111)
Creatinine, Ser: 1.02 mg/dL — ABNORMAL HIGH (ref 0.44–1.00)
GFR calc Af Amer: >60 mL/min (ref >60)
GFR calc non Af Amer: >60 mL/min (ref >60)
Glucose, Bld: 95 mg/dL (ref 70–99)
Potassium: 4.3 mmol/L (ref 3.5–5.1)
Sodium: 144 mmol/L (ref 135–145)
Total Bilirubin: 0.6 mg/dL (ref 0.3–1.2)
Total Protein: 5.6 g/dL — ABNORMAL LOW (ref 6.5–8.1)

## 2020-05-12 LAB — TRIGLYCERIDES: Triglycerides: 521 mg/dL — ABNORMAL HIGH (ref <150)

## 2020-05-12 LAB — CBC WITH DIFFERENTIAL/PLATELET
Abs Immature Granulocytes: 0.04 10*3/uL (ref 0.00–0.07)
Basophils Absolute: 0 10*3/uL (ref 0.0–0.1)
Basophils Relative: 0 %
Eosinophils Absolute: 0.1 10*3/uL (ref 0.0–0.5)
Eosinophils Relative: 1 %
HCT: 34.6 % — ABNORMAL LOW (ref 36.0–46.0)
Hemoglobin: 10.5 g/dL — ABNORMAL LOW (ref 12.0–15.0)
Immature Granulocytes: 0 %
Lymphocytes Relative: 9 %
Lymphs Abs: 0.8 10*3/uL (ref 0.7–4.0)
MCH: 29.7 pg (ref 26.0–34.0)
MCHC: 30.3 g/dL (ref 30.0–36.0)
MCV: 98 fL (ref 80.0–100.0)
Monocytes Absolute: 0.2 10*3/uL (ref 0.1–1.0)
Monocytes Relative: 3 %
Neutro Abs: 8 10*3/uL — ABNORMAL HIGH (ref 1.7–7.7)
Neutrophils Relative %: 87 %
Platelets: 125 10*3/uL — ABNORMAL LOW (ref 150–400)
RBC: 3.53 MIL/uL — ABNORMAL LOW (ref 3.87–5.11)
RDW: 13.9 % (ref 11.5–15.5)
WBC: 9.2 10*3/uL (ref 4.0–10.5)
nRBC: 0 % (ref 0.0–0.2)

## 2020-05-12 LAB — BLOOD GAS, ARTERIAL
Acid-Base Excess: 2.7 mmol/L — ABNORMAL HIGH (ref 0.0–2.0)
Acid-Base Excess: 3.4 mmol/L — ABNORMAL HIGH (ref 0.0–2.0)
Bicarbonate: 28 mmol/L (ref 20.0–28.0)
Bicarbonate: 28.6 mmol/L — ABNORMAL HIGH (ref 20.0–28.0)
Drawn by: 23281
Drawn by: 441261
FIO2: 90
FIO2: 70
MECHVT: 440 mL
MECHVT: 440 mL
O2 Saturation: 94.4 %
O2 Saturation: 92.3 %
PEEP: 12 cmH2O
PEEP: 12 cmH2O
Patient temperature: 37.2
Patient temperature: 37.4
RATE: 26 resp/min
RATE: 26 resp/min
pCO2 arterial: 49.3 mmHg — ABNORMAL HIGH (ref 32.0–48.0)
pCO2 arterial: 49.6 mmHg — ABNORMAL HIGH (ref 32.0–48.0)
pH, Arterial: 7.373 (ref 7.350–7.450)
pH, Arterial: 7.381 (ref 7.350–7.450)
pO2, Arterial: 77.2 mmHg — ABNORMAL LOW (ref 83.0–108.0)
pO2, Arterial: 71.1 mmHg — ABNORMAL LOW (ref 83.0–108.0)

## 2020-05-12 LAB — POCT I-STAT 7, (LYTES, BLD GAS, ICA,H+H)
Acid-Base Excess: 2 mmol/L (ref 0.0–2.0)
Bicarbonate: 29.5 mmol/L — ABNORMAL HIGH (ref 20.0–28.0)
Calcium, Ion: 1.47 mmol/L — ABNORMAL HIGH (ref 1.15–1.40)
HCT: 35 % — ABNORMAL LOW (ref 36.0–46.0)
Hemoglobin: 11.9 g/dL — ABNORMAL LOW (ref 12.0–15.0)
O2 Saturation: 90 %
Patient temperature: 37.4
Potassium: 4.5 mmol/L (ref 3.5–5.1)
Sodium: 148 mmol/L — ABNORMAL HIGH (ref 135–145)
TCO2: 31 mmol/L (ref 22–32)
pCO2 arterial: 59.5 mmHg — ABNORMAL HIGH (ref 32.0–48.0)
pH, Arterial: 7.305 — ABNORMAL LOW (ref 7.350–7.450)
pO2, Arterial: 69 mmHg — ABNORMAL LOW (ref 83.0–108.0)

## 2020-05-12 LAB — GLUCOSE, CAPILLARY
Glucose-Capillary: 142 mg/dL — ABNORMAL HIGH (ref 70–99)
Glucose-Capillary: 140 mg/dL — ABNORMAL HIGH (ref 70–99)
Glucose-Capillary: 93 mg/dL (ref 70–99)
Glucose-Capillary: 112 mg/dL — ABNORMAL HIGH (ref 70–99)
Glucose-Capillary: 141 mg/dL — ABNORMAL HIGH (ref 70–99)
Glucose-Capillary: 183 mg/dL — ABNORMAL HIGH (ref 70–99)
Glucose-Capillary: 160 mg/dL — ABNORMAL HIGH (ref 70–99)

## 2020-05-12 LAB — MAGNESIUM: Magnesium: 2.4 mg/dL (ref 1.7–2.4)

## 2020-05-12 MED ORDER — FUROSEMIDE 10 MG/ML IJ SOLN
40.0000 mg | Freq: Four times a day (QID) | INTRAMUSCULAR | Status: AC
  Administered 2020-05-12 (×2): 40 mg via INTRAVENOUS
  Filled 2020-05-12 (×2): qty 4

## 2020-05-12 MED ORDER — VECURONIUM BROMIDE 10 MG IV SOLR: 0.0000 ug/kg/min | INTRAVENOUS | Status: DC

## 2020-05-12 MED ORDER — FENTANYL BOLUS VIA INFUSION
50.0000 ug | INTRAVENOUS | Status: DC | PRN
  Filled 2020-05-12: qty 50

## 2020-05-12 MED ORDER — FENTANYL CITRATE (PF) 100 MCG/2ML IJ SOLN
50.0000 ug | Freq: Once | INTRAMUSCULAR | Status: AC
  Administered 2020-05-13: 50 ug via INTRAVENOUS
  Filled 2020-05-12: qty 2

## 2020-05-12 MED ORDER — DOPAMINE-DEXTROSE 3.2-5 MG/ML-% IV SOLN
5.0000 ug/kg/min | INTRAVENOUS | Status: DC
  Administered 2020-05-12: 5 ug/kg/min via INTRAVENOUS
  Filled 2020-05-12 (×2): qty 250

## 2020-05-12 MED ORDER — VECURONIUM BOLUS VIA INFUSION: 0.0800 mg/kg | Freq: Once | INTRAVENOUS | Status: DC

## 2020-05-12 MED ORDER — MIDAZOLAM 50MG/50ML (1MG/ML) PREMIX INFUSION
0.5000 mg/h | INTRAVENOUS | Status: DC
  Administered 2020-05-12: 0.5 mg/h via INTRAVENOUS
  Administered 2020-05-12 – 2020-05-13 (×2): 7 mg/h via INTRAVENOUS
  Administered 2020-05-13 (×2): 8 mg/h via INTRAVENOUS
  Administered 2020-05-14: 6.5 mg/h via INTRAVENOUS
  Administered 2020-05-14: 5.5 mg/h via INTRAVENOUS
  Administered 2020-05-14: 5 mg/h via INTRAVENOUS
  Administered 2020-05-15: 5.5 mg/h via INTRAVENOUS
  Administered 2020-05-15 – 2020-05-16 (×3): 4 mg/h via INTRAVENOUS
  Administered 2020-05-17: 5 mg/h via INTRAVENOUS
  Administered 2020-05-17: 4 mg/h via INTRAVENOUS
  Administered 2020-05-18 (×3): 7 mg/h via INTRAVENOUS
  Administered 2020-05-18: 8 mg/h via INTRAVENOUS
  Administered 2020-05-19: 10 mg/h via INTRAVENOUS
  Administered 2020-05-19 (×2): 8 mg/h via INTRAVENOUS
  Administered 2020-05-19: 9 mg/h via INTRAVENOUS
  Administered 2020-05-20 (×3): 10 mg/h via INTRAVENOUS
  Filled 2020-05-12 (×26): qty 50

## 2020-05-12 MED ORDER — PROPOFOL 1000 MG/100ML IV EMUL
5.0000 ug/kg/min | INTRAVENOUS | Status: DC
  Administered 2020-05-12: 5 ug/kg/min via INTRAVENOUS
  Administered 2020-05-12: 18 ug/kg/min via INTRAVENOUS
  Filled 2020-05-12 (×2): qty 100

## 2020-05-12 MED ORDER — ATROPINE SULFATE 1 MG/10ML IJ SOSY
PREFILLED_SYRINGE | INTRAMUSCULAR | Status: AC
  Filled 2020-05-12: qty 10

## 2020-05-12 MED ORDER — MIDAZOLAM HCL 2 MG/2ML IJ SOLN
1.0000 mg | INTRAMUSCULAR | Status: DC | PRN
  Filled 2020-05-12: qty 2

## 2020-05-12 MED ORDER — ATROPINE SULFATE 1 MG/ML IJ SOLN
0.5000 mg | Freq: Once | INTRAMUSCULAR | Status: AC
  Administered 2020-05-12: 0.5 mg via INTRAVENOUS
  Filled 2020-05-12: qty 0.5

## 2020-05-12 MED ORDER — FENTANYL 2500MCG IN NS 250ML (10MCG/ML) PREMIX INFUSION
50.0000 ug/h | INTRAVENOUS | Status: DC
  Administered 2020-05-13: 50 ug/h via INTRAVENOUS
  Administered 2020-05-13: 200 ug/h via INTRAVENOUS
  Filled 2020-05-12 (×2): qty 250

## 2020-05-12 MED ORDER — ARTIFICIAL TEARS OPHTHALMIC OINT
1.0000 "application " | TOPICAL_OINTMENT | Freq: Three times a day (TID) | OPHTHALMIC | Status: DC
  Administered 2020-05-13 – 2020-05-21 (×26): 1 via OPHTHALMIC
  Filled 2020-05-12: qty 3.5

## 2020-05-12 MED ORDER — MIDAZOLAM HCL 2 MG/2ML IJ SOLN
1.0000 mg | INTRAMUSCULAR | Status: DC | PRN
  Administered 2020-05-12 (×2): 4 mg via INTRAVENOUS
  Administered 2020-05-12 – 2020-05-17 (×4): 2 mg via INTRAVENOUS
  Filled 2020-05-12: qty 2
  Filled 2020-05-12 (×2): qty 4
  Filled 2020-05-12 (×2): qty 2

## 2020-05-12 MED ORDER — CHLORHEXIDINE GLUCONATE CLOTH 2 % EX PADS
6.0000 | MEDICATED_PAD | Freq: Every day | CUTANEOUS | Status: DC
  Administered 2020-05-12 – 2020-05-20 (×9): 6 via TOPICAL

## 2020-05-12 NOTE — Progress Notes (Signed)
Patient vent alarm alarming High pressures and patient coughing. Upon assessment with bedside RN, patient had thrown up tube feeding and had a watery bowel movement, Patient cleaned up by staff and HME/ballard changed due to vomiting. Patient was suctioned and copious amounts of tube feeds obtained from ETT. O2 sats dropped to 83-85% during episode. MD aware. RT will continue to monitor patient.

## 2020-05-12 NOTE — Progress Notes (Signed)
NAME:  Donna Campbell, MRN:  [id number], DOB:  [birth year], LOS: 11 ADMISSION DATE:  04/25/2020, CONSULTATION DATE: May 01, 2020 REFERRING MD: EDP, CHIEF COMPLAINT: Dyspnea  Brief History   59 year old female presented to the [facility] long emergency room with dyspnea on May 01, 2020.  She had been diagnosed with COVID on April 26, 2020 at an urgent care.  She was intubated in the evening of July 25.  Past Medical History  GERD Asthma  Significant Hospital Events   July 25 admission/intubation, prone, paralyzed 7/26 NSTEMI July 28-30 recurrent vomiting while prone 8/2 prone held 8/5 Bradycardia events overnight, ventilator changes made  Consults:    Procedures:  ETT 7/25 Left IJ CVL 7/25  Significant Diagnostic Tests:  7/27 Echo > LVEF 45-50%, grade 1 diastolic dystolic, RV size/function normal, valves OK   Micro Data:  July 20 SARS-CoV-2 positive 7/25 blood > negative 7/31 resp > MRSA   Antimicrobials/COVID treatment  July 25 remdesivir>  July 25 Tocilizumab> July 26 July 25 Solu-Medrol 60 mg every 12 hours>  7/31 cefepime >> 8/2 7/31 vanc >>  Interim history/subjective:   Bradycardia events overnight, ventilator changes made, some concern raised over R arm temperature   Objective   Blood pressure (!) 107/51, pulse 71, temperature 99 F (37.2 C), resp. rate (!) 26, height 5\' 4"  (1.626 m), weight 104.9 kg, last menstrual period 07/17/2012, SpO2 97 %.    Vent Mode: PRVC FiO2 (%):  [60 %-90 %] 90 % Set Rate:  [26 bmp] 26 bmp Vt Set:  [330 mL-440 mL] 440 mL PEEP:  [10 cmH20-12 cmH20] 12 cmH20 Plateau Pressure:  [20 cmH20-28 cmH20] 28 cmH20   Intake/Output Summary (Last 24 hours) at 05/12/2020 0759 Last data filed at 05/12/2020 07/12/2020 Gross per 24 hour  Intake 665.04 ml  Output 2100 ml  Net -1434.96 ml   Filed Weights   05/10/20 0232 05/11/20 0418 05/12/20 0500  Weight: 100.3 kg 104.9 kg 104.9 kg    Examination:  General:  In bed on vent HENT: NCAT ETT in  place PULM: CTA B, vent supported breathing CV: RRR, no mgr GI: BS+, soft, nontender MSK: normal bulk and tone Derm: bilateral hands/arms skin warm to touch, good cap refill. Pulses difficult to feel in R hand but present, R hand with good cap refill Neuro: sedated on vent  Aug 5 CXR > independently reviewed, ETT and bibasilar airspace disease noted  Resolved Hospital Problem list     Assessment & Plan:   ARDS due to COVID 19 pneumonia > slow improvement in oxygenation, 8/4 dyssynchrony Continue mechanical ventilation per ARDS protocol Target TVol 6-8cc/kgIBW Target Plateau Pressure < 30cm H20 Target driving pressure less than 15 cm of water Target PaO2 55-65: titrate PEEP/FiO2 per protocol As long as PaO2 to FiO2 ratio is less than 1:150 position in prone position for 16 hours a day Check CVP daily if CVL in place Target CVP less than 4, diurese as necessary Ventilator associated pneumonia prevention protocol 8/5 continue PRVC for now, repeat ABG later this morning to see if any changes, hold prone positioning with brady events/aspiration, lasix x2 doses  MRSA pneumonia  Continue vancomycin  R Arm cool to touch, cap refil intact, pulses noted by doppler and weakly palpable Monitor  blanket  Bradycardia> 12 lead with RBBB and possible RV strain pattern, PE? 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% Repeat echo today Tele Check Mg Atropine to bedside Repeat troponin   Vent dyssynchrony, severe  Need for sedation for mechanical ventilation D/c clonazepam Change versed to prn, use propofol for sedation Continue dilaudid RASS target remains -1 to -2  Hyperglycemia :  Continue detemir and SSI Monitor glucose  AKI > resolved Hypernatremia > improved 8/5 Monitor BMET and UOP Replace electrolytes as needed Continue free water  Goals of care: full code   Best practice:  Diet: tube feeding per protocol Pain/Anxiety/Delirium protocol (if  indicated): as above VAP protocol (if indicated): yes DVT prophylaxis: lovenox GI prophylaxis: Pantoprazole for stress ulcer prophylaxis Glucose control: as above Mobility: bed rest Code Status: full Family Communication: Husband Richard updated by phone on 8/5 Disposition: remain in ICU  Labs   CBC: Recent Labs  Lab 05/06/20 0510 05/07/20 1502 05/08/20 0435 05/09/20 0531 05/10/20 0200 05/11/20 0424 05/12/20 0400  WBC 8.4   < > 12.8* 11.7* 10.2 8.3 9.2  NEUTROABS 6.8  --   --   --  8.9* 6.9 8.0*  HGB 10.9*   < > 11.4* 11.3* 11.1* 11.3* 10.5*  HCT 35.1*   < > 37.3 37.4 36.9 37.0 34.6*  MCV 94.1   < > 97.4 97.7 99.7 99.2 98.0  PLT 204   < > 187 171 144* 147* 125*   < > = values in this interval not displayed.    Basic Metabolic Panel: Recent Labs  Lab 05/06/20 0510 05/06/20 0510 05/07/20 0459 05/07/20 1502 05/08/20 0435 05/09/20 0531 05/10/20 0200 05/11/20 0424 05/12/20 0400  NA 148*   < > 148*   < > 151* 153* 148* 151* 144  K 4.6   < > 5.3*   < > 5.7* 5.3* 5.2* 4.5 4.3  CL 109   < > 108   < > 110 113* 112* 114* 112*  CO2 30   < > 30   < > 31 28 27 28 29   GLUCOSE 255*   < > 263*   < > 230* 197* 223* 104* 95  BUN 95*   < > 78*   < > 87* 96* 91* 83* 73*  CREATININE 1.52*   < > 1.56*   < > 1.40* 1.36* 1.15* 1.09* 1.02*  CALCIUM 9.2   < > 9.3   < > 9.1 9.1 8.8* 9.8 10.1  MG 3.2*  --  3.1*  --  2.9* 3.0*  --   --   --   PHOS 4.5  --  5.2*  --  5.3* 4.5  --   --   --    < > = values in this interval not displayed.   GFR: Estimated Creatinine Clearance: 71 mL/min (A) (by C-G formula based on SCr of 1.02 mg/dL (H)). Recent Labs  Lab 05/09/20 0531 05/10/20 0200 05/11/20 0424 05/12/20 0400  WBC 11.7* 10.2 8.3 9.2    Liver Function Tests: Recent Labs  Lab 05/06/20 0510 05/10/20 0200 05/11/20 0424 05/12/20 0400  AST 36 23 38 47*  ALT 53* 37 36 40  ALKPHOS 64 51 50 49  BILITOT 0.4 0.8 0.8 0.6  PROT 6.2* 5.9* 6.1* 5.6*  ALBUMIN 3.1* 3.0* 3.2* 3.0*   No  results for input(s): LIPASE, AMYLASE in the last 168 hours. No results for input(s): AMMONIA in the last 168 hours.  ABG    Component Value Date/Time   PHART 7.373 05/12/2020 0530   PCO2ART 49.3 (H) 05/12/2020 0530   PO2ART 77.2 (L) 05/12/2020 0530   HCO3 28.0 05/12/2020 0530   ACIDBASEDEF 2.0 05/04/2020 0417   O2SAT 94.4 05/12/2020  0530     Coagulation Profile: No results for input(s): INR, PROTIME in the last 168 hours.  Cardiac Enzymes: No results for input(s): CKTOTAL, CKMB, CKMBINDEX, TROPONINI in the last 168 hours.  HbA1C: No results found for: HGBA1C  CBG: Recent Labs  Lab 05/11/20 1557 05/11/20 1947 05/11/20 2228 05/11/20 2309 05/12/20 0350  GLUCAP 203* 180* 142* 140* 93     Critical care time: 35 minutes     Heber Townsend, MD [place] [facility] Pager: [phone] Cell: [phone] If no response, call [phone]

## 2020-05-12 NOTE — Progress Notes (Addendum)
Abg obtained at 2250 on the following vent settings: PRVC mode, VT=471ml, rr26, 100%, +12peep.   Results for Donna Campbell, Donna Campbell (MRN [id number]) as of 05/12/2020 23:54  Ref. Range 05/12/2020 23:16  Sample type Unknown ARTERIAL  pH, Arterial Latest Ref Range: 7.35 - 7.45  7.305 (L)  pCO2 arterial Latest Ref Range: 32 - 48 mmHg 59.5 (H)  pO2, Arterial Latest Ref Range: 83 - 108 mmHg 69 (L)  TCO2 Latest Ref Range: 22 - 32 mmol/L 31  Acid-Base Excess Latest Ref Range: 0.0 - 2.0 mmol/L 2.0  Bicarbonate Latest Ref Range: 20.0 - 28.0 mmol/L 29.5 (H)  O2 Saturation Latest Units: % 90.0  Patient temperature Unknown 37.4 C    Spo2 showed 83% on the monitor.

## 2020-05-12 NOTE — Progress Notes (Signed)
Abg obtained on the following vent settings:  PRVC mode, VT , rr26, 90%, +12 peep:  Results for [name], Donna Campbell (MRN [id number]) as of 05/12/2020 05:55  Ref. Range 05/12/2020 05:30  Delivery systems Unknown VENTILATOR  FIO2 Unknown 90.00  Mode Unknown PRESSURE REGULATED VOLUME CONTROL  VT Latest Units: mL 440  Peep/cpap Latest Units: cm H20 12.0  pH, Arterial Latest Ref Range: 7.35 - 7.45  7.373  pCO2 arterial Latest Ref Range: 32 - 48 mmHg 49.3 (H)  pO2, Arterial Latest Ref Range: 83 - 108 mmHg 77.2 (L)  Acid-Base Excess Latest Ref Range: 0.0 - 2.0 mmol/L 2.7 (H)  Bicarbonate Latest Ref Range: 20.0 - 28.0 mmol/L 28.0  O2 Saturation Latest Units: % 94.4  Patient temperature Unknown 37.2  Collection site Unknown RIGHT RADIAL  Allens test (pass/fail) Latest Ref Range: PASS  PASS

## 2020-05-12 NOTE — Progress Notes (Addendum)
eLink Physician-Brief Progress Note Patient Name: Donna Campbell DOB: [birth year] MRN: [id number]   Date of Service  05/12/2020  HPI/Events of Note  Ventilator dyssynchrony with desaturation.  eICU Interventions  Vecuronium infusion .        Thomasene Lot Clotine Heiner 05/12/2020, 11:55 PM

## 2020-05-12 NOTE — Progress Notes (Signed)
At 0830, pt had brady episode sustaining in the 30s. 0.5 atropine given per emergency standing orders. MD made aware.   At 1600 pt had another brady episode to 25. This followed mouth care, but then pt sustained in the 30-40s. MD notified and dopamine drip started.

## 2020-05-12 NOTE — Progress Notes (Signed)
[facility] P[facility]  Bradycardic episode again, after mouth care> vagal response? Echo OK Troponin OK Triglycerides high Abg/pH OK  Stop propofol Use prn versed for sedation Dopamine 63mcg/min IV for now  Heber Chino, MD [place] [facility] Pager: [phone] Cell: [phone] If no response, call [phone]

## 2020-05-12 NOTE — Progress Notes (Signed)
Called to patient's bedside due to o2 sats 82-83% on 100% fio2 and +12 peep.  Abg ordered and drawn on current vent settings per Dr Everardo All.  After abg obtained, peep increased to 14 then +16 per Dr Everardo All at bedside.  RN aware of changes made.

## 2020-05-12 NOTE — Progress Notes (Signed)
  Echocardiogram 2D Echocardiogram has been performed.  Donna Campbell G [name] 05/12/2020, 10:28 AM

## 2020-05-12 NOTE — Progress Notes (Deleted)
Patient un-proned at this time. ABG ordered; will obtain at 1100. ETT @ 24 and secure at this time. O2 sats 99% @ start of supination, currently 98%. FiO2 weaned to 80%. Patient desated during, but sats recovered. RT will continue to monitor patient.

## 2020-05-12 NOTE — TOC Progression Note (Signed)
Transition of Care [facility]) - Progression Note    Patient Details  Name: Donna Campbell MRN: [id number] Date of Birth: [birth year]  Transition of Care [facility] CM/SW Contact  Golda Acre, RN Phone Number: 05/12/2020, 7:46 AM  Clinical Narrative:    No changes in treatment. Remains iv sedated and on vent.Unvaccainated female. Following for progression and toc needs.   Expected Discharge Plan: Home/Self Care Barriers to Discharge: Continued Medical Work up  Expected Discharge Plan and Services Expected Discharge Plan: Home/Self Care   Discharge Planning Services: CM Consult   Living arrangements for the past 2 months: Single Family Home                                       Social Determinants of Health (SDOH) Interventions    Readmission Risk Interventions No flowsheet data found.

## 2020-05-13 ENCOUNTER — Inpatient Hospital Stay (HOSPITAL_COMMUNITY): Payer: BC Managed Care – PPO

## 2020-05-13 LAB — BLOOD GAS, ARTERIAL
Acid-base deficit: 0.7 mmol/L (ref 0.0–2.0)
Bicarbonate: 25.6 mmol/L (ref 20.0–28.0)
Drawn by: 270211
FIO2: 90
MECHVT: 440 mL
O2 Saturation: 94 %
PEEP: 16 cmH2O
Patient temperature: 98.6
RATE: 26 resp/min
pCO2 arterial: 52.8 mmHg — ABNORMAL HIGH (ref 32.0–48.0)
pH, Arterial: 7.307 — ABNORMAL LOW (ref 7.350–7.450)
pO2, Arterial: 75.3 mmHg — ABNORMAL LOW (ref 83.0–108.0)

## 2020-05-13 LAB — BASIC METABOLIC PANEL
Anion gap: 10 (ref 5–15)
BUN: 80 mg/dL — ABNORMAL HIGH (ref 6–20)
CO2: 27 mmol/L (ref 22–32)
Calcium: 9.7 mg/dL (ref 8.9–10.3)
Chloride: 109 mmol/L (ref 98–111)
Creatinine, Ser: 1.11 mg/dL — ABNORMAL HIGH (ref 0.44–1.00)
GFR calc Af Amer: >60 mL/min (ref >60)
GFR calc non Af Amer: 55 mL/min — ABNORMAL LOW (ref >60)
Glucose, Bld: 159 mg/dL — ABNORMAL HIGH (ref 70–99)
Potassium: 4.7 mmol/L (ref 3.5–5.1)
Sodium: 146 mmol/L — ABNORMAL HIGH (ref 135–145)

## 2020-05-13 LAB — GLUCOSE, CAPILLARY
Glucose-Capillary: 113 mg/dL — ABNORMAL HIGH (ref 70–99)
Glucose-Capillary: 126 mg/dL — ABNORMAL HIGH (ref 70–99)
Glucose-Capillary: 127 mg/dL — ABNORMAL HIGH (ref 70–99)
Glucose-Capillary: 140 mg/dL — ABNORMAL HIGH (ref 70–99)
Glucose-Capillary: 140 mg/dL — ABNORMAL HIGH (ref 70–99)
Glucose-Capillary: 145 mg/dL — ABNORMAL HIGH (ref 70–99)
Glucose-Capillary: 147 mg/dL — ABNORMAL HIGH (ref 70–99)
Glucose-Capillary: 150 mg/dL — ABNORMAL HIGH (ref 70–99)
Glucose-Capillary: 240 mg/dL — ABNORMAL HIGH (ref 70–99)

## 2020-05-13 LAB — MAGNESIUM: Magnesium: 2.6 mg/dL — ABNORMAL HIGH (ref 1.7–2.4)

## 2020-05-13 MED ORDER — CISATRACURIUM BOLUS VIA INFUSION
0.0500 mg/kg | Freq: Once | INTRAVENOUS | Status: AC
  Administered 2020-05-13: 5.2 mg via INTRAVENOUS
  Filled 2020-05-13: qty 6

## 2020-05-13 MED ORDER — METOCLOPRAMIDE HCL 5 MG/ML IJ SOLN: 5.0000 mg | Freq: Four times a day (QID) | INTRAMUSCULAR | Status: DC

## 2020-05-13 MED ORDER — SODIUM CHLORIDE 0.9 % IV SOLN
0.5000 ug/kg/min | INTRAVENOUS | Status: DC
  Administered 2020-05-13 – 2020-05-14 (×3): 3 ug/kg/min via INTRAVENOUS
  Administered 2020-05-15: 3.5 ug/kg/min via INTRAVENOUS
  Administered 2020-05-15: 3 ug/kg/min via INTRAVENOUS
  Administered 2020-05-15: 3.5 ug/kg/min via INTRAVENOUS
  Administered 2020-05-16 – 2020-05-17 (×3): 3 ug/kg/min via INTRAVENOUS
  Filled 2020-05-13 (×15): qty 20

## 2020-05-13 MED ORDER — PIVOT 1.5 CAL PO LIQD
1000.0000 mL | ORAL | Status: DC
  Administered 2020-05-13: 1000 mL
  Filled 2020-05-13 (×3): qty 1000

## 2020-05-13 MED ORDER — PROSOURCE TF PO LIQD
45.0000 mL | Freq: Three times a day (TID) | ORAL | Status: DC
  Administered 2020-05-13 – 2020-05-15 (×4): 45 mL
  Filled 2020-05-13 (×7): qty 45

## 2020-05-13 MED ORDER — METOCLOPRAMIDE HCL 5 MG/ML IJ SOLN
5.0000 mg | Freq: Four times a day (QID) | INTRAMUSCULAR | Status: AC
  Administered 2020-05-13 – 2020-05-14 (×4): 5 mg via INTRAVENOUS
  Filled 2020-05-13 (×4): qty 2

## 2020-05-13 MED ORDER — FUROSEMIDE 10 MG/ML IJ SOLN
40.0000 mg | Freq: Four times a day (QID) | INTRAMUSCULAR | Status: AC
  Administered 2020-05-13 (×2): 40 mg via INTRAVENOUS
  Filled 2020-05-13 (×2): qty 4

## 2020-05-13 NOTE — Progress Notes (Signed)
NAME:  DAYLEN HACK, MRN:  [id number], DOB:  1961-06-21, LOS: 12 ADMISSION DATE:  2020-05-24, CONSULTATION DATE: May 24, 2020 REFERRING MD: EDP, CHIEF COMPLAINT: Dyspnea  Brief History   59 year old female presented to the [facility] long emergency room with dyspnea on 24-May-2020.  She had been diagnosed with COVID on April 26, 2020 at an urgent care.  She was intubated in the evening of July 25.  Past Medical History  GERD Asthma  Significant Hospital Events   July 25 admission/intubation, prone, paralyzed 7/26 NSTEMI July 28-30 recurrent vomiting while prone 8/2 prone held 8/5 Bradycardia events overnight, ventilator changes made, versed drip held> cough, vomit, aspirated  Consults:    Procedures:  ETT 7/25 >  Left IJ CVL 7/25 >   Significant Diagnostic Tests:  7/27 Echo > LVEF 45-50%, grade 1 diastolic dystolic, RV size/function normal, valves OK 8/5 Echo> LVEF WNL, RV normal size/function, valves OK  Micro Data:  July 20 SARS-CoV-2 positive 7/25 blood > negative 7/31 resp > MRSA  8/6 resp culture >   Antimicrobials/COVID treatment  July 25 remdesivir>  July 25 Tocilizumab> July 26 July 25 Solu-Medrol 60 mg every 12 hours>  7/31 cefepime >> 8/2 7/31 vanc >> 8/6  Interim history/subjective:   Vomited overnight Aspirated  Oxygenation worse Sedation increased, paralyzed  Objective   Blood pressure 140/70, pulse 81, temperature 98.6 F (37 C), resp. rate (!) 26, height 5\' 4"  (1.626 m), weight 104.9 kg, last menstrual period 07/17/2012, SpO2 (!) 87 %.    Vent Mode: PRVC FiO2 (%):  [50 %-100 %] 100 % Set Rate:  [26 bmp] 26 bmp Vt Set:  [440 mL] 440 mL PEEP:  [12 cmH20-16 cmH20] 16 cmH20 Plateau Pressure:  [26 cmH20-40 cmH20] 34 cmH20   Intake/Output Summary (Last 24 hours) at 05/13/2020 07/13/2020 Last data filed at 05/12/2020 1714 Gross per 24 hour  Intake 1748.93 ml  Output 2050 ml  Net -301.07 ml   Filed Weights   05/11/20 0418 05/12/20 0500 05/13/20 0500    Weight: 104.9 kg 104.9 kg 104.9 kg    Examination:  General:  In bed on vent HENT: NCAT ETT in place PULM: Crackles bases B, vent supported breathing CV: RRR, no mgr GI: BS+, soft, nontender MSK: normal bulk and tone Neuro: sedated, paralyzed on vent  Aug6 CXR > independently reviewed, ETT and bibasilar airspace disease perhaps a little worse in the right lower lobe, essentially unchanged  Resolved Hospital Problem list     Assessment & Plan:   ARDS due to COVID 19 pneumonia > worsening 8/6 after aspiration event Continue mechanical ventilation per ARDS protocol Target TVol 6-8cc/kgIBW Target Plateau Pressure < 30cm H20 Target driving pressure less than 15 cm of water Target PaO2 55-65: titrate PEEP/FiO2 per protocol As long as PaO2 to FiO2 ratio is less than 1:150 position in prone position for 16 hours a day Check CVP daily if CVL in place Target CVP less than 4, diurese as necessary Ventilator associated pneumonia prevention protocol 8/6 continue PRVC again today, continue neuromuscular blockade, send resp culture, hold antibiotics for now, monitor for fever, WBC change; place post pyloric tube> no enteral nutrition until that is in place, may need to consider tracheostomy next week  Recurrent aspiration events (both supine and prone) No more enteral feeding until post pyloric tube in place Oxycodone held Consider pro-motility agent  MRSA pneumonia  Vancomycin stops today  R Arm cool to touch, cap refil intact, pulses noted by doppler  and weakly palpable Monitor, blanket  Bradycardia> 12 lead with RBBB and possible RV strain pattern, PE? 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% > improved on 8/5 echo Tele Check Mg Dopamine > hold today  Vent dyssynchrony, severe > worsened significantly on 8/5 when versed drip held> cough, vomit, aspirated Need for sedation for mechanical ventilation  Maintain neuromuscular blockade  now RASS target -4 to -5 per NMB protocol  Dilaudid infusion, versed infusion  Hopeful to change off of NMB infusion to PRN on 8/7  Hyperglycemia :  Continue detemir and SSI Monitor glucose  AKI > resolved Hypernatremia > improved 8/5 Monitor BMET and UOP Replace electrolytes as needed Continue free water  Thrombocytopenia/anemia: Monitor for bleeding Transfuse PRBC for Hgb < 7 gm/dL  Goals of care: full code   Best practice:  Diet: tube feeding per protocol Pain/Anxiety/Delirium protocol (if indicated): as above VAP protocol (if indicated): yes DVT prophylaxis: lovenox GI prophylaxis: Pantoprazole for stress ulcer prophylaxis Glucose control: as above Mobility: bed rest Code Status: full Family Communication: Husband Richard updated by phone on 8/6 Disposition: remain in ICU  Labs   CBC: Recent Labs  Lab 05/08/20 0435 05/08/20 0435 05/09/20 0531 05/10/20 0200 05/11/20 0424 05/12/20 0400 05/12/20 2316  WBC 12.8*  --  11.7* 10.2 8.3 9.2  --   NEUTROABS  --   --   --  8.9* 6.9 8.0*  --   HGB 11.4*   < > 11.3* 11.1* 11.3* 10.5* 11.9*  HCT 37.3   < > 37.4 36.9 37.0 34.6* 35.0*  MCV 97.4  --  97.7 99.7 99.2 98.0  --   PLT 187  --  171 144* 147* 125*  --    < > = values in this interval not displayed.    Basic Metabolic Panel: Recent Labs  Lab 05/07/20 0459 05/07/20 1502 05/08/20 0435 05/08/20 0435 05/09/20 0531 05/09/20 0531 05/10/20 0200 05/11/20 0424 05/12/20 0400 05/12/20 2316 05/13/20 0500  NA 148*   < > 151*   < > 153*   < > 148* 151* 144 148* 146*  K 5.3*   < > 5.7*   < > 5.3*   < > 5.2* 4.5 4.3 4.5 4.7  CL 108   < > 110   < > 113*  --  112* 114* 112*  --  109  CO2 30   < > 31   < > 28  --  27 28 29   --  27  GLUCOSE 263*   < > 230*   < > 197*  --  223* 104* 95  --  159*  BUN 78*   < > 87*   < > 96*  --  91* 83* 73*  --  80*  CREATININE 1.56*   < > 1.40*   < > 1.36*  --  1.15* 1.09* 1.02*  --  1.11*  CALCIUM 9.3   < > 9.1   < > 9.1  --  8.8*  9.8 10.1  --  9.7  MG 3.1*  --  2.9*  --  3.0*  --   --   --  2.4  --  2.6*  PHOS 5.2*  --  5.3*  --  4.5  --   --   --   --   --   --    < > = values in this interval not displayed.   GFR: Estimated Creatinine Clearance: 65.2 mL/min (A) (by C-G formula  based on SCr of 1.11 mg/dL (H)). Recent Labs  Lab 05/09/20 0531 05/10/20 0200 05/11/20 0424 05/12/20 0400  WBC 11.7* 10.2 8.3 9.2    Liver Function Tests: Recent Labs  Lab 05/10/20 0200 05/11/20 0424 05/12/20 0400  AST 23 38 47*  ALT 37 36 40  ALKPHOS 51 50 49  BILITOT 0.8 0.8 0.6  PROT 5.9* 6.1* 5.6*  ALBUMIN 3.0* 3.2* 3.0*   No results for input(s): LIPASE, AMYLASE in the last 168 hours. No results for input(s): AMMONIA in the last 168 hours.  ABG    Component Value Date/Time   PHART 7.305 (L) 05/12/2020 2316   PCO2ART 59.5 (H) 05/12/2020 2316   PO2ART 69 (L) 05/12/2020 2316   HCO3 29.5 (H) 05/12/2020 2316   TCO2 31 05/12/2020 2316   ACIDBASEDEF 2.0 05/04/2020 0417   O2SAT 90.0 05/12/2020 2316     Coagulation Profile: No results for input(s): INR, PROTIME in the last 168 hours.  Cardiac Enzymes: No results for input(s): CKTOTAL, CKMB, CKMBINDEX, TROPONINI in the last 168 hours.  HbA1C: No results found for: HGBA1C  CBG: Recent Labs  Lab 05/12/20 1557 05/12/20 1956 05/12/20 2039 05/12/20 2346 05/13/20 0347  GLUCAP 240* 183* 160* 126* 147*     Critical care time: 35 minutes     Heber Turin, MD [place] [facility] Pager: [phone] Cell: [phone] If no response, call [phone]

## 2020-05-13 NOTE — Progress Notes (Signed)
[facility] P[facility]M  Radiology was unable to pass enteric tube into post pyloric space Would rather not start TPN due to infection risk Will restart tube feedings at trickle rate, then give prokinetic overnight. Will start with reglan for 24 hours.  Heber [place], MD  PCCM Pager: [phone] Cell: [phone] If no response, call [phone]

## 2020-05-13 NOTE — Progress Notes (Signed)
Nutrition Follow-up  DOCUMENTATION CODES:   Obesity unspecified  INTERVENTION:  - will monitor for post-pyloric tube placement. - once TF able to be re-started via tube, Pivot 1.5 @ 20 ml/hr to advance by 10 ml every 24 hours to reach goal rate of 50 ml/hr with 45 ml Prosource TF TID. - at goal rate, this regimen will provide 1920 kcal (96% estimated kcal need), 145 grams protein, and 911 ml free water.  - continue rena-vit as Mg slightly elevated, K at high end of normal. - free water flush to continue to be per CCM.   - recommend initiate TPN per ASPEN guidelines for critically ill COVID+ patients who do not tolerate TF/are unable to reach goal TF rate.    NUTRITION DIAGNOSIS:   Increased nutrient needs related to acute illness, catabolic illness (COVID-19 infection) as evidenced by estimated needs. -ongoing  GOAL:   Provide needs based on ASPEN/SCCM guidelines -unmet  MONITOR:   Vent status, TF tolerance, Labs, Weight trends, Skin  ASSESSMENT:   Pt admitted with severe ARDS, COVID-19 pneumonia. PMH includes asthma, GERD.  Significant Events: 7/25- admission; intubation and OGT placement; initial RD assessment; TF initiation  8/1- aspiration event after vomiting TF while proned 8/2- patient assessed at bedside by RD--NFPE completed 8/5- aspiration event after large volume vomiting TF, copious amount of TF suctioned from mouth and ETT   Patient remains intubated with OGT in place. TF remains off following vomiting event yesterday at ~1900. Able to talk with RN who reports plan for post-pyloric tube placement.   Concern that even if tube able to be placed post-pyloric, patient may continue to not tolerate TF advancement to goal given previous episodes. Patient has not been receiving adequate nutrition. ASPEN guidelines outline TPN recommended in situations such as this.   Patient currently has a triple lumen L IJ. RN reports that 1 lumen is free and could be used for TPN.    Patient is supine. No plan for proning. HOB currently at  ~42 degrees.   Re-estimated kcal need based on admission wt of 101.7 kg; wt trending up but has been stable over the past 2 days (or copied forward). Mild edema to all extremities documented in flow sheet.    Patient is currently intubated on ventilator support MV: 10.8 L/min Temp (24hrs), Avg:99.2 F (37.3 C), Min:98.2 F (36.8 C), Max:100.4 F (38 C) Propofol: none BP: 138/86 and MAP: 100  Labs reviewed; CBG: 147 mg/dl, Na: 948 mmol/l, BUN: 80 mg/dl, creatinine: 5.46 mg/dl, Ca: 2.70 mg/dl, Mg: 2.6 mg/dl, GFR: 55 ml/min.  Medications reviewed; 45 ml colace BID, 40 mg IV lasix x2 doses 8/6, sliding scale novolog, 25 units levemir BID, 40 mg solu-medrol/day, 1 tablet rena-vit/day, 40 mg protonix per OGT/day, 17 g miralax/day, 5 ml senokot BID. Drips; nimbex @ 4 mcg/kg/min, versed @ 8 mg/hr   Diet Order:   Diet Order            Diet NPO time specified  Diet effective now                 EDUCATION NEEDS:   No education needs have been identified at this time  Skin:  Skin Assessment: Reviewed RN Assessment (MASD to perineum)  Last BM:  8/5  Height:   Ht Readings from Last 1 Encounters:  05/04/20 5\' 4"  (1.626 m)    Weight:   Wt Readings from Last 1 Encounters:  05/13/20 104.9 kg     Estimated Nutritional Needs:  Kcal:  1991 kcal (PSU equation) Protein:  122-152 grams Fluid:  >/= 2.5 L/day     Trenton Gammon, MS, RD, LDN, CNSC Inpatient Clinical Dietitian RD pager # available in [facility]  After hours/weekend pager # available in [facility]

## 2020-05-14 ENCOUNTER — Inpatient Hospital Stay (HOSPITAL_COMMUNITY): Payer: BC Managed Care – PPO

## 2020-05-14 DIAGNOSIS — J9601 Acute respiratory failure with hypoxia: Secondary | ICD-10-CM

## 2020-05-14 LAB — CBC WITH DIFFERENTIAL/PLATELET
Abs Immature Granulocytes: 0.04 10*3/uL (ref 0.00–0.07)
Basophils Absolute: 0 10*3/uL (ref 0.0–0.1)
Basophils Relative: 0 %
Eosinophils Absolute: 0.1 10*3/uL (ref 0.0–0.5)
Eosinophils Relative: 1 %
HCT: 39.9 % (ref 36.0–46.0)
Hemoglobin: 12.1 g/dL (ref 12.0–15.0)
Immature Granulocytes: 0 %
Lymphocytes Relative: 7 %
Lymphs Abs: 0.9 10*3/uL (ref 0.7–4.0)
MCH: 29.9 pg (ref 26.0–34.0)
MCHC: 30.3 g/dL (ref 30.0–36.0)
MCV: 98.5 fL (ref 80.0–100.0)
Monocytes Absolute: 0.6 10*3/uL (ref 0.1–1.0)
Monocytes Relative: 5 %
Neutro Abs: 10.1 10*3/uL — ABNORMAL HIGH (ref 1.7–7.7)
Neutrophils Relative %: 87 %
Platelets: 162 10*3/uL (ref 150–400)
RBC: 4.05 MIL/uL (ref 3.87–5.11)
RDW: 14.6 % (ref 11.5–15.5)
WBC: 11.7 10*3/uL — ABNORMAL HIGH (ref 4.0–10.5)
nRBC: 0 % (ref 0.0–0.2)

## 2020-05-14 LAB — COMPREHENSIVE METABOLIC PANEL
ALT: 55 U/L — ABNORMAL HIGH (ref 0–44)
AST: 33 U/L (ref 15–41)
Albumin: 3.2 g/dL — ABNORMAL LOW (ref 3.5–5.0)
Alkaline Phosphatase: 56 U/L (ref 38–126)
Anion gap: 10 (ref 5–15)
BUN: 89 mg/dL — ABNORMAL HIGH (ref 6–20)
CO2: 27 mmol/L (ref 22–32)
Calcium: 9.3 mg/dL (ref 8.9–10.3)
Chloride: 115 mmol/L — ABNORMAL HIGH (ref 98–111)
Creatinine, Ser: 1.2 mg/dL — ABNORMAL HIGH (ref 0.44–1.00)
GFR calc Af Amer: 58 mL/min — ABNORMAL LOW (ref >60)
GFR calc non Af Amer: 50 mL/min — ABNORMAL LOW (ref >60)
Glucose, Bld: 127 mg/dL — ABNORMAL HIGH (ref 70–99)
Potassium: 4.1 mmol/L (ref 3.5–5.1)
Sodium: 152 mmol/L — ABNORMAL HIGH (ref 135–145)
Total Bilirubin: 0.8 mg/dL (ref 0.3–1.2)
Total Protein: 6.4 g/dL — ABNORMAL LOW (ref 6.5–8.1)

## 2020-05-14 LAB — POCT I-STAT 7, (LYTES, BLD GAS, ICA,H+H)
Acid-Base Excess: 0 mmol/L (ref 0.0–2.0)
Bicarbonate: 28.6 mmol/L — ABNORMAL HIGH (ref 20.0–28.0)
Calcium, Ion: 1.38 mmol/L (ref 1.15–1.40)
HCT: 35 % — ABNORMAL LOW (ref 36.0–46.0)
Hemoglobin: 11.9 g/dL — ABNORMAL LOW (ref 12.0–15.0)
O2 Saturation: 83 %
Patient temperature: 37.7
Potassium: 4.5 mmol/L (ref 3.5–5.1)
Sodium: 153 mmol/L — ABNORMAL HIGH (ref 135–145)
TCO2: 31 mmol/L (ref 22–32)
pCO2 arterial: 66.3 mmHg (ref 32.0–48.0)
pH, Arterial: 7.247 — ABNORMAL LOW (ref 7.350–7.450)
pO2, Arterial: 59 mmHg — ABNORMAL LOW (ref 83.0–108.0)

## 2020-05-14 LAB — GLUCOSE, CAPILLARY
Glucose-Capillary: 112 mg/dL — ABNORMAL HIGH (ref 70–99)
Glucose-Capillary: 133 mg/dL — ABNORMAL HIGH (ref 70–99)
Glucose-Capillary: 138 mg/dL — ABNORMAL HIGH (ref 70–99)
Glucose-Capillary: 139 mg/dL — ABNORMAL HIGH (ref 70–99)
Glucose-Capillary: 149 mg/dL — ABNORMAL HIGH (ref 70–99)
Glucose-Capillary: 163 mg/dL — ABNORMAL HIGH (ref 70–99)

## 2020-05-14 LAB — BLOOD GAS, ARTERIAL
Acid-base deficit: 0 mmol/L (ref 0.0–2.0)
Allens test (pass/fail): POSITIVE — AB
Bicarbonate: 27.1 mmol/L (ref 20.0–28.0)
FIO2: 100
O2 Saturation: 85.9 %
Patient temperature: 99.5
pCO2 arterial: 59.9 mmHg — ABNORMAL HIGH (ref 32.0–48.0)
pH, Arterial: 7.281 — ABNORMAL LOW (ref 7.350–7.450)
pO2, Arterial: 60.9 mmHg — ABNORMAL LOW (ref 83.0–108.0)

## 2020-05-14 MED ORDER — SODIUM CHLORIDE 0.9 % IV SOLN
2.0000 g | Freq: Three times a day (TID) | INTRAVENOUS | Status: DC
  Administered 2020-05-14 (×2): 2 g via INTRAVENOUS
  Filled 2020-05-14 (×3): qty 2

## 2020-05-14 MED ORDER — VANCOMYCIN HCL 1500 MG/300ML IV SOLN
1500.0000 mg | INTRAVENOUS | Status: DC
  Administered 2020-05-14: 1500 mg via INTRAVENOUS
  Filled 2020-05-14: qty 300

## 2020-05-14 MED ORDER — FREE WATER
400.0000 mL | Status: DC
  Administered 2020-05-15 (×2): 100 mL

## 2020-05-14 MED ORDER — METHYLPREDNISOLONE SODIUM SUCC 125 MG IJ SOLR
60.0000 mg | Freq: Two times a day (BID) | INTRAMUSCULAR | Status: DC
  Administered 2020-05-14 – 2020-05-20 (×13): 60 mg via INTRAVENOUS
  Filled 2020-05-14 (×13): qty 2

## 2020-05-14 MED ORDER — PANTOPRAZOLE SODIUM 40 MG IV SOLR
40.0000 mg | Freq: Every day | INTRAVENOUS | Status: DC
  Administered 2020-05-14 – 2020-05-20 (×7): 40 mg via INTRAVENOUS
  Filled 2020-05-14 (×7): qty 40

## 2020-05-14 NOTE — Progress Notes (Signed)
Pharmacy Antibiotic Note  Donna Campbell is a 59 y.o. female admitted on 04/17/2020 with ARDS due to COVID-19 pneumonia. CXR today: worsening pneumonia throughout both lungs, most confluent in the right lower lobe. Pharmacy consulted for Vancomycin and Cefepime dosing on 7/31. 7 days of vanc completed on 8/6 for MRSA PNA. CCM reconsulted for vanc and cefepime the following day  Today, 05/14/20 Today will be Day 8 of vanc, Day 1 of cefepime WBC 11.7 up some (on solumedrol however) SCr 1.2 Afebrile  Plan: Restart vanc 1500mg  IV q24 Cefepime 2g IV q8 Monitor renal function, cultures, clinical course  Height: 5\' 4"  (162.6 cm) Weight: 104.9 kg (231 lb 4.2 oz) IBW/kg (Calculated) : 54.7  Temp (24hrs), Avg:99.5 F (37.5 C), Min:99 F (37.2 C), Max:100.8 F (38.2 C)  Recent Labs  Lab 05/09/20 0531 05/09/20 0531 05/10/20 0200 05/11/20 0424 05/11/20 1151 05/12/20 0400 05/13/20 0500 05/14/20 0500  WBC 11.7*  --  10.2 8.3  --  9.2  --  11.7*  CREATININE 1.36*   < > 1.15* 1.09*  --  1.02* 1.11* 1.20*  VANCOTROUGH  --   --   --   --  16  --   --   --    < > = values in this interval not displayed.    Estimated Creatinine Clearance: 60.3 mL/min (A) (by C-G formula based on SCr of 1.2 mg/dL (H)).    Allergies  Allergen Reactions  . Naproxen Hives    Pt says she can take ibuprofen  . Sulfonamide Derivatives Hives    Antimicrobials this admission: 7/26 Remdesivir >> 7/30 7/31 Vancomycin >> 7/31 Cefepime >> 8/3, 8/7 >>   Dose adjustments this admission: --  Microbiology results: 7/25 BCx: NGF 7/25 MRSA PCR: positive  7/31 Trach aspirate: MRSA 8/6 trach aspirate: pending  Thank you for allowing pharmacy to be a part of this patient's care.  8/25, PharmD, BCPS 05/14/2020 11:25 AM

## 2020-05-14 NOTE — Progress Notes (Signed)
NAME:  Donna Campbell, MRN:  [id number], DOB:  09/13/1961, LOS: 13 ADMISSION DATE:  04/28/2020, CONSULTATION DATE: May 01, 2020 REFERRING MD: EDP, CHIEF COMPLAINT: Dyspnea  Brief History   59 year old female presented to the [facility] long emergency room with dyspnea on May 01, 2020.  She had been diagnosed with COVID on April 26, 2020 at an urgent care.  She was intubated in the evening of July 25.  Past Medical History  GERD Asthma  Significant Hospital Events   July 25 admission/intubation, prone, paralyzed 7/26 NSTEMI July 28-30 recurrent vomiting while prone 8/2 prone held 8/5 Bradycardia events overnight, ventilator changes made, versed drip held> cough, vomit, aspirated 8/7 refractory hypoxia with saturations in low 80s  Consults:    Procedures:  ETT 7/25 >  Left IJ CVL 7/25 >   Significant Diagnostic Tests:  7/27 Echo > LVEF 45-50%, grade 1 diastolic dystolic, RV size/function normal, valves OK 8/5 Echo> LVEF WNL, RV normal size/function, valves OK  Micro Data:  July 20 SARS-CoV-2 positive 7/25 blood > negative 7/31 resp > MRSA 8/6 resp culture > abundant WBC, mostly PMN, abundant GPC  Antimicrobials/COVID treatment  July 25 remdesivir>  July 25 Tocilizumab> July 26 July 25 Solu-Medrol 60 mg every 12 hours>  7/31 cefepime >> 8/2 7/31 vanc >> 8/6  Interim history/subjective:   Refractory hypoxia overnight with increasing oxygen requirements.  Did not tolerate a bath with a move around the bed overnight.  This morning sats in the low 80s, dropping with rolling to her right side and has not been able to recover.    Objective   Blood pressure (!) 90/50, pulse (!) 119, temperature 99.9 F (37.7 C), resp. rate (!) 26, height 5\' 4"  (1.626 m), weight 104.9 kg, last menstrual period 07/17/2012, SpO2 (!) 85 %.    Vent Mode: PRVC FiO2 (%):  [80 %-100 %] 100 % Set Rate:  [26 bmp] 26 bmp Vt Set:  [440 mL] 440 mL PEEP:  [16 cmH20] 16 cmH20 Plateau Pressure:  [30  cmH20-32 cmH20] 32 cmH20   Intake/Output Summary (Last 24 hours) at 05/14/2020 1107 Last data filed at 05/14/2020 0800 Gross per 24 hour  Intake 1472.08 ml  Output 1635 ml  Net -162.92 ml   Filed Weights   05/12/20 0500 05/13/20 0500 05/14/20 0500  Weight: 104.9 kg 104.9 kg 104.9 kg    Examination: General: Critically ill-appearing woman intubated, sedated, on MV HENT: [place]/AT, eyes anicteric, ETT and OGT in place PULM: CTAB anteriorly, reduced bibasilar breath sounds.  Minimal tracheal secretions. CV: Regular rate and rhythm, no murmurs GI: Obese, soft, nondistended MSK: Normal muscle mass for age Neuro: Heavily sedated, paralyzed on vent. PERRL.  Aug 7 CXR > personally reviewed-ET tube about 4 cm above the carina, bilateral basilar opacities, more dense left peripherally with silhouetting of the lateral left hemidiaphragm.   Bedside ultrasound and echocardiogram performed.  Normal RV wall motion and size. Septal flattening but bulging into LV.  Normal LV wall motion.  Bilateral sliding lung and multiple anterior and lateral interspaces.  Resolved Hospital Problem list     Assessment & Plan:   Severe ARDS due to COVID 19 pneumonia > worsening 8/6 after aspiration event -Continue low tidal volume ventilation, 4-8 cc/kg ideal body weight.  Not meeting goal.  Current plateau 36, driving pressure 16.  Vent adjustments made.  ABG this morning with additional adjustments after as tolerated.  Permissive hypercapnia. -Reinitiate prone ventilation today given worsening with refractory hypoxia. -Increase Solu-Medrol  to 60 mg twice daily -Goal net negative volume status -VAP prevention protocol -Restart broad-spectrum antibiotics-cefepime and vancomycin -Continue to follow respiratory culture -NMB -If family wishes to continue aggressive care, likely tracheostomy is in her future.  Recurrent aspiration events (both supine and prone) -holding enteral feeds currently.  NGT to suction this  morning given need for prone ventilation.  MRSA pneumonia; likely unresolved based on 8/6 respiratory culture with abundant PMNs and GPC's -Restart vancomycin  R Arm cool to touch, cap refill intact, pulses noted by doppler and weakly palpable--not noted on exam today -Continue to monitor  Bradycardia> 12 lead with RBBB and possible RV strain pattern, PE? 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  Vent dyssynchrony, severe > worsened significantly on 8/5 when versed drip held> cough, vomit, aspirated Need for sedation for mechanical ventilation  -NMB, goal RASS -4 -5 Dilaudid infusion, versed infusion   Hyperglycemia-controlled -Continue detemir and SSI -Goal BG 140-180  AKI > resolved Hypernatremia > improved 8/5 -Monitor BMET and UOP Replace electrolytes as needed -Increase free water  Thrombocytopenia/anemia: -Continue to monitor -Transfuse PRBC for Hgb < 7 gm/dL  Goals of care: full code   Best practice:  Diet: tube feeding per protocol Pain/Anxiety/Delirium protocol (if indicated): as above VAP protocol (if indicated): yes DVT prophylaxis: lovenox GI prophylaxis: Pantoprazole for stress ulcer prophylaxis Glucose control: as above Mobility: bed rest Code Status: full Family Communication: Husband Richard updated by phone on 8/7- understands that today is a significant setback Disposition: remain in ICU  Labs   CBC: Recent Labs  Lab 05/09/20 0531 05/09/20 0531 05/10/20 0200 05/11/20 0424 05/12/20 0400 05/12/20 2316 05/14/20 0500  WBC 11.7*  --  10.2 8.3 9.2  --  11.7*  NEUTROABS  --   --  8.9* 6.9 8.0*  --  10.1*  HGB 11.3*   < > 11.1* 11.3* 10.5* 11.9* 12.1  HCT 37.4   < > 36.9 37.0 34.6* 35.0* 39.9  MCV 97.7  --  99.7 99.2 98.0  --  98.5  PLT 171  --  144* 147* 125*   --  162   < > = values in this interval not displayed.    Basic Metabolic Panel: Recent Labs  Lab 05/08/20 0435 05/08/20 0435 05/09/20 0531 05/09/20 0531 05/10/20 0200 05/10/20 0200 05/11/20 0424 05/12/20 0400 05/12/20 2316 05/13/20 0500 05/14/20 0500  NA 151*   < > 153*   < > 148*   < > 151* 144 148* 146* 152*  K 5.7*   < > 5.3*   < > 5.2*   < > 4.5 4.3 4.5 4.7 4.1  CL 110   < > 113*   < > 112*  --  114* 112*  --  109 115*  CO2 31   < > 28   < > 27  --  28 29  --  27 27  GLUCOSE 230*   < > 197*   < > 223*  --  104* 95  --  159* 127*  BUN 87*   < > 96*   < > 91*  --  83* 73*  --  80* 89*  CREATININE 1.40*   < > 1.36*   < > 1.15*  --  1.09* 1.02*  --  1.11* 1.20*  CALCIUM 9.1   < > 9.1   < >  8.8*  --  9.8 10.1  --  9.7 9.3  MG 2.9*  --  3.0*  --   --   --   --  2.4  --  2.6*  --   PHOS 5.3*  --  4.5  --   --   --   --   --   --   --   --    < > = values in this interval not displayed.   GFR: Estimated Creatinine Clearance: 60.3 mL/min (A) (by C-G formula based on SCr of 1.2 mg/dL (H)). Recent Labs  Lab 05/10/20 0200 05/11/20 0424 05/12/20 0400 05/14/20 0500  WBC 10.2 8.3 9.2 11.7*    Liver Function Tests: Recent Labs  Lab 05/10/20 0200 05/11/20 0424 05/12/20 0400 05/14/20 0500  AST 23 38 47* 33  ALT 37 36 40 55*  ALKPHOS 51 50 49 56  BILITOT 0.8 0.8 0.6 0.8  PROT 5.9* 6.1* 5.6* 6.4*  ALBUMIN 3.0* 3.2* 3.0* 3.2*   No results for input(s): LIPASE, AMYLASE in the last 168 hours. No results for input(s): AMMONIA in the last 168 hours.  ABG    Component Value Date/Time   PHART 7.281 (L) 05/14/2020 0319   PCO2ART 59.9 (H) 05/14/2020 0319   PO2ART 60.9 (L) 05/14/2020 0319   HCO3 27.1 05/14/2020 0319   TCO2 31 05/12/2020 2316   ACIDBASEDEF 0.0 05/14/2020 0319   O2SAT 85.9 05/14/2020 0319     Coagulation Profile: No results for input(s): INR, PROTIME in the last 168 hours.  Cardiac Enzymes: No results for input(s): CKTOTAL, CKMB, CKMBINDEX, TROPONINI in  the last 168 hours.  HbA1C: No results found for: HGBA1C  CBG: Recent Labs  Lab 05/13/20 1605 05/13/20 2001 05/13/20 2340 05/14/20 0342 05/14/20 0755  GLUCAP 145* 150* 113* 112* 138*     This patient is critically ill with multiple organ system failure which requires frequent high complexity decision making, assessment, support, evaluation, and titration of therapies. This was completed through the application of advanced monitoring technologies and extensive interpretation of multiple databases. During this encounter critical care time was devoted to patient care services described in this note for 50 minutes.  Steffanie Dunn, DO 05/14/20 11:22 AM [facility]

## 2020-05-14 NOTE — Progress Notes (Signed)
eLink Physician-Brief Progress Note Patient Name: Donna Campbell DOB: Jan 23, 1961 MRN: [id number]   Date of Service  05/14/2020  HPI/Events of Note  Inquiry about water flushes, PO meds and length of time proned Patient proned at noon today and remains proned with improvement in O2 saturation Was on water flushes 400 q 4. Tube feeding placed on hold  eICU Interventions  Continue water flushes at 100 q 4 and monitor Na, most recent 153 Switch protonix to IV Maintain proned overnight     Intervention Category Intermediate Interventions: Other:;Electrolyte abnormality - evaluation and management  Darl Pikes 05/14/2020, 10:06 PM

## 2020-05-15 ENCOUNTER — Inpatient Hospital Stay (HOSPITAL_COMMUNITY): Payer: BC Managed Care – PPO

## 2020-05-15 DIAGNOSIS — Z9911 Dependence on respirator [ventilator] status: Secondary | ICD-10-CM

## 2020-05-15 DIAGNOSIS — J939 Pneumothorax, unspecified: Secondary | ICD-10-CM

## 2020-05-15 LAB — GLUCOSE, CAPILLARY
Glucose-Capillary: 162 mg/dL — ABNORMAL HIGH (ref 70–99)
Glucose-Capillary: 168 mg/dL — ABNORMAL HIGH (ref 70–99)
Glucose-Capillary: 160 mg/dL — ABNORMAL HIGH (ref 70–99)
Glucose-Capillary: 181 mg/dL — ABNORMAL HIGH (ref 70–99)
Glucose-Capillary: 177 mg/dL — ABNORMAL HIGH (ref 70–99)
Glucose-Capillary: 172 mg/dL — ABNORMAL HIGH (ref 70–99)
Glucose-Capillary: 135 mg/dL — ABNORMAL HIGH (ref 70–99)

## 2020-05-15 LAB — BASIC METABOLIC PANEL
Anion gap: 12 (ref 5–15)
BUN: 113 mg/dL — ABNORMAL HIGH (ref 6–20)
CO2: 23 mmol/L (ref 22–32)
Calcium: 8.9 mg/dL (ref 8.9–10.3)
Chloride: 115 mmol/L — ABNORMAL HIGH (ref 98–111)
Creatinine, Ser: 2.4 mg/dL — ABNORMAL HIGH (ref 0.44–1.00)
GFR calc Af Amer: 25 mL/min — ABNORMAL LOW (ref >60)
GFR calc non Af Amer: 22 mL/min — ABNORMAL LOW (ref >60)
Glucose, Bld: 187 mg/dL — ABNORMAL HIGH (ref 70–99)
Potassium: 5.5 mmol/L — ABNORMAL HIGH (ref 3.5–5.1)
Sodium: 150 mmol/L — ABNORMAL HIGH (ref 135–145)

## 2020-05-15 LAB — CBC WITH DIFFERENTIAL/PLATELET
Abs Immature Granulocytes: 0.11 10*3/uL — ABNORMAL HIGH (ref 0.00–0.07)
Basophils Absolute: 0.1 10*3/uL (ref 0.0–0.1)
Basophils Relative: 1 %
Eosinophils Absolute: 0 10*3/uL (ref 0.0–0.5)
Eosinophils Relative: 0 %
HCT: 41 % (ref 36.0–46.0)
Hemoglobin: 11.7 g/dL — ABNORMAL LOW (ref 12.0–15.0)
Immature Granulocytes: 1 %
Lymphocytes Relative: 4 %
Lymphs Abs: 0.6 10*3/uL — ABNORMAL LOW (ref 0.7–4.0)
MCH: 29.4 pg (ref 26.0–34.0)
MCHC: 28.5 g/dL — ABNORMAL LOW (ref 30.0–36.0)
MCV: 103 fL — ABNORMAL HIGH (ref 80.0–100.0)
Monocytes Absolute: 0.5 10*3/uL (ref 0.1–1.0)
Monocytes Relative: 3 %
Neutro Abs: 14.1 10*3/uL — ABNORMAL HIGH (ref 1.7–7.7)
Neutrophils Relative %: 91 %
Platelets: 170 10*3/uL (ref 150–400)
RBC: 3.98 MIL/uL (ref 3.87–5.11)
RDW: 14.9 % (ref 11.5–15.5)
WBC: 15.4 10*3/uL — ABNORMAL HIGH (ref 4.0–10.5)
nRBC: 0 % (ref 0.0–0.2)

## 2020-05-15 LAB — BLOOD GAS, ARTERIAL
Acid-base deficit: 6.4 mmol/L — ABNORMAL HIGH (ref 0.0–2.0)
Bicarbonate: 21.6 mmol/L (ref 20.0–28.0)
FIO2: 100
O2 Saturation: 92.9 %
Patient temperature: 98.6
pCO2 arterial: 57.6 mmHg — ABNORMAL HIGH (ref 32.0–48.0)
pH, Arterial: 7.2 — ABNORMAL LOW (ref 7.350–7.450)
pO2, Arterial: 74.7 mmHg — ABNORMAL LOW (ref 83.0–108.0)

## 2020-05-15 LAB — TRIGLYCERIDES: Triglycerides: 488 mg/dL — ABNORMAL HIGH (ref <150)

## 2020-05-15 LAB — POTASSIUM
Potassium: 5.3 mmol/L — ABNORMAL HIGH (ref 3.5–5.1)
Potassium: 5.4 mmol/L — ABNORMAL HIGH (ref 3.5–5.1)
Potassium: 5.5 mmol/L — ABNORMAL HIGH (ref 3.5–5.1)
Potassium: 5.2 mmol/L — ABNORMAL HIGH (ref 3.5–5.1)

## 2020-05-15 LAB — NA AND K (SODIUM & POTASSIUM), RAND UR
Potassium Urine: 43 mmol/L
Sodium, Ur: <10 mmol/L

## 2020-05-15 MED ORDER — DEXTROSE 5 % IV SOLN: INTRAVENOUS | Status: DC

## 2020-05-15 MED ORDER — SODIUM ZIRCONIUM CYCLOSILICATE 10 G PO PACK
10.0000 g | PACK | Freq: Once | ORAL | Status: AC
  Administered 2020-05-15: 10 g
  Filled 2020-05-15: qty 1

## 2020-05-15 MED ORDER — FREE WATER
100.0000 mL | Status: DC
  Administered 2020-05-15 – 2020-05-21 (×33): 100 mL

## 2020-05-15 MED ORDER — SODIUM CHLORIDE 0.9 % IV SOLN
2.0000 g | INTRAVENOUS | Status: DC
  Administered 2020-05-15: 2 g via INTRAVENOUS

## 2020-05-15 MED ORDER — FUROSEMIDE 10 MG/ML IJ SOLN
40.0000 mg | Freq: Once | INTRAMUSCULAR | Status: AC
  Administered 2020-05-15: 40 mg via INTRAVENOUS
  Filled 2020-05-15: qty 4

## 2020-05-15 MED ORDER — ENOXAPARIN SODIUM 30 MG/0.3ML ~~LOC~~ SOLN
30.0000 mg | Freq: Every day | SUBCUTANEOUS | Status: DC
  Administered 2020-05-16 – 2020-05-20 (×5): 30 mg via SUBCUTANEOUS
  Filled 2020-05-15 (×5): qty 0.3

## 2020-05-15 MED ORDER — VANCOMYCIN HCL 1500 MG/300ML IV SOLN
1500.0000 mg | INTRAVENOUS | Status: DC
  Administered 2020-05-16: 1500 mg via INTRAVENOUS
  Filled 2020-05-15: qty 300

## 2020-05-15 MED ORDER — LACTATED RINGERS IV BOLUS
500.0000 mL | Freq: Once | INTRAVENOUS | Status: AC
  Administered 2020-05-15: 500 mL via INTRAVENOUS

## 2020-05-15 NOTE — Progress Notes (Signed)
eLink Physician-Brief Progress Note Patient Name: Donna Campbell DOB: [birth year] MRN: [id number]   Date of Service  05/15/2020  HPI/Events of Note  Notified of K 5.5 Creatinine also increased to 2.4 Na down to 150 on 100 cc water flushes q 4 Urine output tapered down  eICU Interventions  Ordered lasix 40 mg as per hyperkalemia protocol and re check K in 4 hours     Intervention Category Major Interventions: Electrolyte abnormality - evaluation and management  Darl Pikes 05/15/2020, 6:02 AM

## 2020-05-15 NOTE — Progress Notes (Signed)
Spoke to Halsey RN early in the shift to inquire about complete PO status. PT tube feeds were stopped and previous flushes were held but it was unclear if I was to continue holding flushes, and if they wanted any meds given through the OG tonight. I received report from [employer] and she reported that the patient didn't appear to be emptying her stomach which has led to the multiple episodes of aspiration. I also was unsure of how long patient needed to be in the prone position for, and RT did not receive any additional information. Patient sp02 was 80% when I arrived on shift and has been increasingly getting better.   Spoke to Dr. Violet Baldy and she instructed me to continue doing flushes every 4 hours due to patients increased sodium levels. I was instructed to hold several PO meds, and she transitioned the pantoprazole to be given PIV tonight.   Patient current sp02 is 93% in the prone position, HR has decreased from 130's to 100's. Temperature is being decreased using ice packs. PT currently synchronous with vent RR of 32.   Requested AM labs to continue monitoring electrolytes since sodium has been elevated and patient has been unable to receive PO fluids. I also made e-link aware urine output has decreased as expected. Sedation, pain medication, and paralytic has been slowly increased for comfort. HR has lowered significantly since last night. Will continue to assess for change in status.

## 2020-05-15 NOTE — Plan of Care (Signed)
CXR with significant improvement in size of right pneumothorax, now with small area of R lateral SQ emphysema. Chest tube with persistent air leak, although slowing. Frothy bloody output from chest tube. Saturations increased to 97%, but peak pressures remain high. Vt returned ~80cc less than delivered Vt.  Will take chest tube off suction and drop PEEP to 16.  Steffanie Dunn, DO 05/15/20 3:43 PM [facility]

## 2020-05-15 NOTE — Plan of Care (Signed)
CXR with large R pneumothorax. Confirmed with bedside US. Emergent small bore chest tube placement with large air leak. Follow up CXR ordered.  Steffanie Dunn, DO 05/15/20 3:01 PM [facility]

## 2020-05-15 NOTE — Procedures (Signed)
Insertion of Chest Tube Procedure Note  NAFISAH RUNIONS  [id number]  08/21/1961  Date:05/15/20  Time:3:04 PM    Provider Performing: Steffanie Dunn   Procedure: Pleural Catheter Insertion w/o Imaging Guidance (84132) Site marked and confirmed with [place]  Indication(s) Pneumothorax  Consent Unable to obtain consent due to emergent nature of procedure.  Anesthesia Topical only with 1% lidocaine    Time Out Verified patient identification, verified procedure, site/side was marked, verified correct patient position, special equipment/implants available, medications/allergies/relevant history reviewed, required imaging and test results available.   Sterile Technique Maximal sterile technique including full sterile barrier drape, hand hygiene, sterile gown, sterile gloves, mask, hair covering, sterile ultrasound probe cover (if used).   Procedure Description Ultrasound used to identify appropriate pleural anatomy for placement and overlying skin marked. Area of placement cleaned and draped in sterile fashion.  A 14 French pigtail pleural catheter was placed into the left pleural space using Seldinger technique. Appropriate return of air was obtained.  The tube was connected to atrium and placed on -20 cm H2O wall suction.   Complications/Tolerance None; patient tolerated the procedure well. Chest X-ray is ordered to verify placement.   EBL Minimal  Specimen(s) none   Follow up CXR pending.  Husband called to provide status change and chest tube placement.  Steffanie Dunn, DO 05/15/20 3:06 PM [facility]

## 2020-05-15 NOTE — Progress Notes (Addendum)
RN entered room to find ventilator alarming at rate of 66 times/min and minute volume 0.01. RN notified RT- RN suctioned via ETT and no relief. RT advanced ETT and reinflated cuff. Stat chest x-ray ordered and MD notified. O2 sats 96% at this time  Chestine Spore MD notifed of chest x-ray completion. Right pneumothorax noted on chest x-ray and chest tube placed. Per MD, RN administered 2 mg bolus of dilaudid before insertion. Initially placed to -20cm suction and 100 cc blood tinged fluid removed. MD aware.   Repeat chest x-ray taken after chest tube placement- lung reexpanding- orders received to place chest tube to water seal. Will continue to monitor.

## 2020-05-15 NOTE — Progress Notes (Addendum)
NAME:  Donna Campbell, MRN:  [id number], DOB:  06-Nov-1960, LOS: 14 ADMISSION DATE:  04/07/2020, CONSULTATION DATE: May 01, 2020 REFERRING MD: EDP, CHIEF COMPLAINT: Dyspnea  Brief History   59 year old female presented to the [facility] long emergency room with dyspnea on May 01, 2020.  She had been diagnosed with COVID on April 26, 2020 at an urgent care.  She was intubated in the evening of July 25.  Past Medical History  GERD Asthma  Significant Hospital Events   July 25 admission/intubation, prone, paralyzed 7/26 NSTEMI July 28-30 recurrent vomiting while prone 8/2 prone held 8/5 Bradycardia events overnight, ventilator changes made, versed drip held> cough, vomit, aspirated 8/7 refractory hypoxia with saturations in low 80s  Consults:    Procedures:  ETT 7/25 >  Left IJ CVL 7/25 >   Significant Diagnostic Tests:  7/27 Echo > LVEF 45-50%, grade 1 diastolic dystolic, RV size/function normal, valves OK 8/5 Echo> LVEF WNL, RV normal size/function, valves OK  Micro Data:  July 20 SARS-CoV-2 positive 7/25 blood > negative 7/31 resp > MRSA 8/6 resp culture > abundant WBC, mostly PMN, abundant GPC  Antimicrobials/COVID treatment  July 25 remdesivir>  July 25 Tocilizumab> July 26 July 25 Solu-Medrol 60 mg every 12 hours>  7/31 cefepime >> 8/2 7/31 vanc >> 8/6  Interim history/subjective:  Oxygen saturations slowly increased overnight, remains on 90% with NMB, proned. Decreasing UOP.  Objective   Blood pressure (!) 105/59, pulse (!) 110, temperature 99.9 F (37.7 C), resp. rate (!) 32, height 5\' 4"  (1.626 m), weight 100.4 kg, last menstrual period 07/17/2012, SpO2 93 %.    Vent Mode: PRVC FiO2 (%):  [20 %-100 %] 100 % Set Rate:  [32 bmp] 32 bmp Vt Set:  [380 mL] 380 mL PEEP:  [20 cmH20] 20 cmH20 Plateau Pressure:  [34 cmH20-38 cmH20] 34 cmH20   Intake/Output Summary (Last 24 hours) at 05/15/2020 0926 Last data filed at 05/15/2020 07/15/2020 Gross per 24 hour  Intake 2432.81  ml  Output 1170 ml  Net 1262.81 ml   Filed Weights   05/13/20 0500 05/14/20 0500 05/15/20 0410  Weight: 104.9 kg 104.9 kg 100.4 kg    Examination: General: Critically ill-appearing woman intubated, sedated, proned, under neuromuscular blockade HENT: [place]/AT, left eye anicteric with reactive pupil. PULM: CTAB posteriorly.  Plateau pressure 34, driving pressure 14.  No significant tracheal secretions. CV: Tachycardic, regular rhythm, no murmurs GI: Obese, soft, nondistended MSK: Normal muscle mass for age, no peripheral edema.  Both hands are cold, but feet warm. Neuro: RASS -5, paralyzed chemically.  Left pupil reactive.  Unable to move assess right pupil in current position.    Resolved Hospital Problem list     Assessment & Plan:   Severe ARDS due to COVID 19 pneumonia > worsening 8/6 after aspiration event MRSA pneumonia-persistent -Continue low tidal volume ventilation, 4-8 cc/kg ideal body weight.  Not meeting goal.  Current plateau 36, driving pressure 16.  Vent adjustments made.  ABG this morning with additional adjustments after as tolerated.  Permissive hypercapnia. -Maintain prone positioning past 16 hours given severe ongoing respiratory failure and concern for recurrent desaturations with proning.  It took most of the day yesterday to get her sats to an acceptable level. -Continue Solu-Medrol to 60 mg twice daily -VAP prevention protocol -Continue broad-spectrum antibiotics-cefepime and vancomycin -Continue to follow repeat tracheal aspirate culture sensitivities -NMB to continue -If family wishes to continue aggressive care, likely tracheostomy is in her future.  Recurrent aspiration  events (both supine and prone) -holding enteral feeds currently; has high residuals every time they were checked after free water or oral meds were given and the tube is clamped.  R Arm cool to touch, cap refill intact, pulses noted by doppler and weakly palpable -Continue to  monitor -May need bilateral radial ultrasounds  Bradycardia> 12 lead with RBBB and possible RV strain pattern, PE? 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  Vent dyssynchrony, severe > worsened significantly on 8/5 when versed drip held> cough, vomit, aspirated Need for sedation for mechanical ventilation  -NMB, goal RASS -4 to -5 -Continue Dilaudid infusion, versed infusion   Hyperglycemia-controlled -Continue detemir and SSI -Goal BG 140-180  AKI > recurrent.  With undetectable sodium this is likely prerenal. Hypernatremia > improved 8/5 Hyperkalemia; mildly peaked lateral T waves. -Monitor BMET and UOP -Replace electrolytes as needed -Decrease enteral free water and supplement with D5W IV -LR bolus today; continue to monitor urine output -Repeat BMP this morning.  No response to Lasix earlier. Lokelma.  Thrombocytopenia/anemia: -Continue to monitor -Transfuse PRBC for Hgb < 7 gm/dL  Goals of care: full code; husband wants everything done   Best practice:  Diet: tube feeding per protocol Pain/Anxiety/Delirium protocol (if indicated): as above VAP protocol (if indicated): yes DVT prophylaxis: lovenox GI prophylaxis: Pantoprazole for stress ulcer prophylaxis Glucose control: as above Mobility: bed rest Code Status: full Family Communication: Husband Richard updated by phone on 8/8- understands that today is a significant setback Disposition: remain in ICU  Labs   CBC: Recent Labs  Lab 05/10/20 0200 05/10/20 0200 05/11/20 0424 05/11/20 0424 05/12/20 0400 05/12/20 2316 05/14/20 0500 05/14/20 1147 05/15/20 0400  WBC 10.2  --  8.3  --  9.2  --  11.7*  --  15.4*  NEUTROABS 8.9*  --  6.9  --  8.0*  --  10.1*  --  14.1*  HGB 11.1*   < > 11.3*   < > 10.5* 11.9* 12.1 11.9* 11.7*   HCT 36.9   < > 37.0   < > 34.6* 35.0* 39.9 35.0* 41.0  MCV 99.7  --  99.2  --  98.0  --  98.5  --  103.0*  PLT 144*  --  147*  --  125*  --  162  --  170   < > = values in this interval not displayed.    Basic Metabolic Panel: Recent Labs  Lab 05/09/20 0531 05/10/20 0200 05/11/20 0424 05/11/20 0424 05/12/20 0400 05/12/20 0400 05/12/20 2316 05/12/20 2316 05/13/20 0500 05/14/20 0500 05/14/20 1147 05/15/20 0400 05/15/20 0601  NA 153*   < > 151*   < > 144   < > 148*  --  146* 152* 153* 150*  --   K 5.3*   < > 4.5   < > 4.3   < > 4.5   < > 4.7 4.1 4.5 5.5* 5.3*  CL 113*   < > 114*  --  112*  --   --   --  109 115*  --  115*  --   CO2 28   < > 28  --  29  --   --   --  27 27  --  23  --   GLUCOSE 197*   < > 104*  --  95  --   --   --  159* 127*  --  187*  --   BUN 96*   < > 83*  --  73*  --   --   --  80* 89*  --  113*  --   CREATININE 1.36*   < > 1.09*  --  1.02*  --   --   --  1.11* 1.20*  --  2.40*  --   CALCIUM 9.1   < > 9.8  --  10.1  --   --   --  9.7 9.3  --  8.9  --   MG 3.0*  --   --   --  2.4  --   --   --  2.6*  --   --   --   --   PHOS 4.5  --   --   --   --   --   --   --   --   --   --   --   --    < > = values in this interval not displayed.   GFR: Estimated Creatinine Clearance: 29.4 mL/min (A) (by C-G formula based on SCr of 2.4 mg/dL (H)). Recent Labs  Lab 05/11/20 0424 05/12/20 0400 05/14/20 0500 05/15/20 0400  WBC 8.3 9.2 11.7* 15.4*    Liver Function Tests: Recent Labs  Lab 05/10/20 0200 05/11/20 0424 05/12/20 0400 05/14/20 0500  AST 23 38 47* 33  ALT 37 36 40 55*  ALKPHOS 51 50 49 56  BILITOT 0.8 0.8 0.6 0.8  PROT 5.9* 6.1* 5.6* 6.4*  ALBUMIN 3.0* 3.2* 3.0* 3.2*   No results for input(s): LIPASE, AMYLASE in the last 168 hours. No results for input(s): AMMONIA in the last 168 hours.  ABG    Component Value Date/Time   PHART 7.247 (L) 05/14/2020 1147   PCO2ART 66.3 (HH) 05/14/2020 1147   PO2ART 59 (L) 05/14/2020 1147   HCO3 28.6 (H)  05/14/2020 1147   TCO2 31 05/14/2020 1147   ACIDBASEDEF 0.0 05/14/2020 0319   O2SAT 83.0 05/14/2020 1147     Coagulation Profile: No results for input(s): INR, PROTIME in the last 168 hours.  Cardiac Enzymes: No results for input(s): CKTOTAL, CKMB, CKMBINDEX, TROPONINI in the last 168 hours.  HbA1C: No results found for: HGBA1C  CBG: Recent Labs  Lab 05/14/20 1520 05/14/20 2013 05/14/20 2355 05/15/20 0343 05/15/20 0743  GLUCAP 149* 133* 139* 162* 168*     This patient is critically ill with multiple organ system failure which requires frequent high complexity decision making, assessment, support, evaluation, and titration of therapies. This was completed through the application of advanced monitoring technologies and extensive interpretation of multiple databases. During this encounter critical care time was devoted to patient care services described in this note for 38 minutes.  Steffanie Dunn, DO 05/15/20 9:26 AM [facility]

## 2020-05-15 NOTE — Progress Notes (Signed)
Pharmacy Antibiotic Note  Donna Campbell is a 59 y.o. female admitted on 05-18-20 with ARDS due to COVID-19 pneumonia. CXR today: worsening pneumonia throughout both lungs, most confluent in the right lower lobe. Pharmacy consulted for Vancomycin and Cefepime dosing on 7/31. 7 days of vanc completed on 8/6 for MRSA PNA. CCM reconsulted for vanc and cefepime the following day on 8/7  Today, 05/15/20 Today will be Day 9 of vanc, Day 2 of cefepime WBC 15.4 up some (on solumedrol however) SCr double from yesterday - 1.2 to 2.4, est CrCl now 29 ml/min Tmax 101.1  Plan: Adjust vanc to 1500mg  IV q48 per acute renal failure Adjust cefepime from 2g IV q8 to 2g IV q24 per acute renal failure Monitor renal function, cultures, clinical course  Height: 5\' 4"  (162.6 cm) Weight: 100.4 kg (221 lb 5.5 oz) IBW/kg (Calculated) : 54.7  Temp (24hrs), Avg:100.4 F (38 C), Min:99.5 F (37.5 C), Max:101.1 F (38.4 C)  Recent Labs  Lab 05/10/20 0200 05/10/20 0200 05/11/20 0424 05/11/20 1151 05/12/20 0400 05/13/20 0500 05/14/20 0500 05/15/20 0400  WBC 10.2  --  8.3  --  9.2  --  11.7* 15.4*  CREATININE 1.15*   < > 1.09*  --  1.02* 1.11* 1.20* 2.40*  VANCOTROUGH  --   --   --  16  --   --   --   --    < > = values in this interval not displayed.    Estimated Creatinine Clearance: 29.4 mL/min (A) (by C-G formula based on SCr of 2.4 mg/dL (H)).    Allergies  Allergen Reactions  . Naproxen Hives    Pt says she can take ibuprofen  . Sulfonamide Derivatives Hives    Antimicrobials this admission: 7/26 Remdesivir >> 7/30 7/31 Vancomycin >> 7/31 Cefepime >> 8/3, 8/7 >>   Dose adjustments this admission: --  Microbiology results: 7/25 BCx: NGF 7/25 MRSA PCR: positive  7/31 Trach aspirate: MRSA 8/6 trach aspirate: re-incubated for better growth  Thank you for allowing pharmacy to be a part of this patient's care.  8/25, PharmD, BCPS 05/15/2020 7:55 AM

## 2020-05-16 ENCOUNTER — Inpatient Hospital Stay (HOSPITAL_COMMUNITY): Payer: BC Managed Care – PPO

## 2020-05-16 LAB — GLUCOSE, CAPILLARY
Glucose-Capillary: 115 mg/dL — ABNORMAL HIGH (ref 70–99)
Glucose-Capillary: 136 mg/dL — ABNORMAL HIGH (ref 70–99)
Glucose-Capillary: 155 mg/dL — ABNORMAL HIGH (ref 70–99)
Glucose-Capillary: 176 mg/dL — ABNORMAL HIGH (ref 70–99)
Glucose-Capillary: 172 mg/dL — ABNORMAL HIGH (ref 70–99)
Glucose-Capillary: 151 mg/dL — ABNORMAL HIGH (ref 70–99)

## 2020-05-16 LAB — CULTURE, RESPIRATORY W GRAM STAIN

## 2020-05-16 LAB — BLOOD GAS, ARTERIAL
Acid-base deficit: 6.8 mmol/L — ABNORMAL HIGH (ref 0.0–2.0)
Bicarbonate: 19.7 mmol/L — ABNORMAL LOW (ref 20.0–28.0)
Drawn by: 29503
FIO2: 70
MECHVT: 380 mL
O2 Saturation: 96.7 %
PEEP: 10 cmH2O
Patient temperature: 98.6
RATE: 32 resp/min
pCO2 arterial: 47.1 mmHg (ref 32.0–48.0)
pH, Arterial: 7.246 — ABNORMAL LOW (ref 7.350–7.450)
pO2, Arterial: 97.8 mmHg (ref 83.0–108.0)

## 2020-05-16 LAB — BASIC METABOLIC PANEL
Anion gap: 10 (ref 5–15)
BUN: 143 mg/dL — ABNORMAL HIGH (ref 6–20)
CO2: 23 mmol/L (ref 22–32)
Calcium: 8.1 mg/dL — ABNORMAL LOW (ref 8.9–10.3)
Chloride: 113 mmol/L — ABNORMAL HIGH (ref 98–111)
Creatinine, Ser: 3.09 mg/dL — ABNORMAL HIGH (ref 0.44–1.00)
GFR calc Af Amer: 18 mL/min — ABNORMAL LOW (ref >60)
GFR calc non Af Amer: 16 mL/min — ABNORMAL LOW (ref >60)
Glucose, Bld: 135 mg/dL — ABNORMAL HIGH (ref 70–99)
Potassium: 5.2 mmol/L — ABNORMAL HIGH (ref 3.5–5.1)
Sodium: 146 mmol/L — ABNORMAL HIGH (ref 135–145)

## 2020-05-16 LAB — POTASSIUM
Potassium: 4.9 mmol/L (ref 3.5–5.1)
Potassium: 5 mmol/L (ref 3.5–5.1)
Potassium: 5.2 mmol/L — ABNORMAL HIGH (ref 3.5–5.1)
Potassium: 5.4 mmol/L — ABNORMAL HIGH (ref 3.5–5.1)

## 2020-05-16 MED ORDER — ADULT MULTIVITAMIN LIQUID CH
15.0000 mL | Freq: Every day | ORAL | Status: DC
  Administered 2020-05-16 – 2020-05-20 (×5): 15 mL
  Filled 2020-05-16 (×5): qty 15

## 2020-05-16 NOTE — Progress Notes (Signed)
NAME:  Donna Campbell, MRN:  [id number], DOB:  [birth year], LOS: 15 ADMISSION DATE:  May 30, 2020, CONSULTATION DATE: 2020-05-30 REFERRING MD: EDP, CHIEF COMPLAINT: Dyspnea  Brief History   59 year old female presented to the [facility] long emergency room with dyspnea on May 30, 2020.  She had been diagnosed with COVID on April 26, 2020 at an urgent care.  She was intubated in the evening of July 25.  Past Medical History  GERD Asthma  Significant Hospital Events   July 25 admission/intubation, prone, paralyzed 7/26 NSTEMI July 28-30 recurrent vomiting while prone 8/2 prone held 8/5 Bradycardia events overnight, ventilator changes made, versed drip held> cough, vomit, aspirated 8/7 refractory hypoxia with saturations in low 80s 8/8 large right-sided pneumothorax-chest tube placed with resolution  Consults:    Procedures:  ETT 7/25 >  Left IJ CVL 7/25 >  8/8 chest tube placement Significant Diagnostic Tests:  7/27 Echo > LVEF 45-50%, grade 1 diastolic dystolic, RV size/function normal, valves OK 8/5 Echo> LVEF WNL, RV normal size/function, valves OK  Micro Data:  July 20 SARS-CoV-2 positive 7/25 blood > negative 7/31 resp > MRSA 8/6 resp culture > abundant WBC, mostly PMN, abundant GPC  Antimicrobials/COVID treatment  July 25 remdesivir>  July 25 Tocilizumab> July 26 July 25 Solu-Medrol 60 mg every 12 hours>  7/31 cefepime >> 8/2 7/31 vanc >> 8/6  Interim history/subjective:  Oxygen saturations slowly increased overnight, remains on 90% with NMB, remains prone at present Pneumothorax resolved on chest x-ray  Objective   Blood pressure (!) 153/61, pulse 90, temperature (!) 97.5 F (36.4 C), resp. rate (!) 32, height 5\' 4"  (1.626 m), weight 100.8 kg, last menstrual period 07/17/2012, SpO2 100 %.    Vent Mode: PRVC FiO2 (%):  [70 %-100 %] 70 % Set Rate:  [32 bmp] 32 bmp Vt Set:  [380 mL] 380 mL PEEP:  [10 cmH20-20 cmH20] 10 cmH20 Plateau Pressure:  [19 cmH20-20  cmH20] 20 cmH20   Intake/Output Summary (Last 24 hours) at 05/16/2020 1218 Last data filed at 05/16/2020 1100 Gross per 24 hour  Intake 3148.91 ml  Output 1745 ml  Net 1403.91 ml   Filed Weights   05/14/20 0500 05/15/20 0410 05/16/20 0455  Weight: 104.9 kg 100.4 kg 100.8 kg    Examination: General: Proned, on neuromuscular blockade HENT: Anicteric PULM: Clear to auscultation posteriorly.  No significant secretions CV: Tachycardic, regular rhythm, no murmurs GI: Obese, soft, nondistended MSK: Normal muscle mass for age, no peripheral edema.  Both hands are cold, but feet warm. Neuro: RASS -5, paralyzed chemically.   Resolved Hospital Problem list     Assessment & Plan:   Severe ARDS due to COVID 19 pneumonia > worsening 8/6 after aspiration event MRSA pneumonia-persistent -Continue low tidal volume ventilation, 4-8 cc/kg ideal body weight.  Not meeting goal.  Maintain driving pressure goal of about 15.  Vent adjustments made. Permissive hypercapnia -Prone positioning was maintained for over 16 hours -Continue Solu-Medrol to 60 mg twice daily -VAP prevention protocol -Continue broad-spectrum antibiotics-cefepime and vancomycin -Continue to follow repeat tracheal aspirate culture-MRSA on 8/6 -NMB to continue -If family wishes to continue aggressive care, likely tracheostomy is in her future. -We will unprone today to reduce the risks of skin breakdown.  Recurrent aspiration events (both supine and prone) -holding enteral feeds currently; has high residuals every time they were checked after free water or oral meds were given and the tube is clamped.  R Arm cool to touch, cap refill  intact, pulses noted by doppler and weakly palpable -Continue to monitor -May need bilateral radial ultrasounds  Pneumothorax -Resolved with chest tube placement -Continue to monitor  Bradycardia> 12 lead with RBBB and possible RV strain pattern, PE? 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  Vent dyssynchrony, severe > worsened significantly on 8/5 when versed drip held> cough, vomit, aspirated Need for sedation for mechanical ventilation  -NMB, goal RASS -4 to -5 -Continue Dilaudid infusion, versed infusion   Hyperglycemia-controlled -Continue detemir and SSI -Goal BG 140-180  AKI > recurrent.  With undetectable sodium this is likely prerenal. Hypernatremia > improved 8/5 Hyperkalemia; mildly peaked lateral T waves. -Monitor BMET and UOP -Replace electrolytes as needed -Decrease enteral free water and supplement with D5W IV -Current potassium is 5.0  Thrombocytopenia/anemia: -Continue to monitor -Transfuse PRBC for Hgb < 7 gm/dL  Goals of care: full code; husband wants everything done  We will unprone today-need to give her a break from the prone position to avoid skin breakdown Maintain chest tube to suction Risk of decompensation remains very high   Best practice:  Diet: tube feeding per protocol Pain/Anxiety/Delirium protocol (if indicated): as above VAP protocol (if indicated): yes DVT prophylaxis: lovenox GI prophylaxis: Pantoprazole for stress ulcer prophylaxis Glucose control: as above Mobility: bed rest Code Status: full Family Communication: Husband Richard updated by phone on 8/8- understands that today is a significant setback Disposition: remain in ICU  Labs   CBC: Recent Labs  Lab 05/10/20 0200 05/10/20 0200 05/11/20 0424 05/11/20 0424 05/12/20 0400 05/12/20 2316 05/14/20 0500 05/14/20 1147 05/15/20 0400  WBC 10.2  --  8.3  --  9.2  --  11.7*  --  15.4*  NEUTROABS 8.9*  --  6.9  --  8.0*  --  10.1*  --  14.1*  HGB 11.1*   < > 11.3*   < > 10.5* 11.9* 12.1 11.9* 11.7*  HCT 36.9   < > 37.0   < > 34.6* 35.0* 39.9 35.0* 41.0  MCV 99.7  --  99.2  --  98.0   --  98.5  --  103.0*  PLT 144*  --  147*  --  125*  --  162  --  170   < > = values in this interval not displayed.    Basic Metabolic Panel: Recent Labs  Lab 05/12/20 0400 05/12/20 2316 05/13/20 0500 05/13/20 0500 05/14/20 0500 05/14/20 0500 05/14/20 1147 05/14/20 1147 05/15/20 0400 05/15/20 0601 05/15/20 1930 05/16/20 0015 05/16/20 0201 05/16/20 0530 05/16/20 1141  NA 144   < > 146*  --  152*  --  153*  --  150*  --   --   --   --  146*  --   K 4.3   < > 4.7   < > 4.1   < > 4.5   < > 5.5*   < > 5.2* 4.9 5.4* 5.2* 5.0  CL 112*  --  109  --  115*  --   --   --  115*  --   --   --   --  113*  --   CO2 29  --  27  --  27  --   --   --  23  --   --   --   --  23  --  GLUCOSE 95  --  159*  --  127*  --   --   --  187*  --   --   --   --  135*  --   BUN 73*  --  80*  --  89*  --   --   --  113*  --   --   --   --  143*  --   CREATININE 1.02*  --  1.11*  --  1.20*  --   --   --  2.40*  --   --   --   --  3.09*  --   CALCIUM 10.1  --  9.7  --  9.3  --   --   --  8.9  --   --   --   --  8.1*  --   MG 2.4  --  2.6*  --   --   --   --   --   --   --   --   --   --   --   --    < > = values in this interval not displayed.   GFR: Estimated Creatinine Clearance: 22.9 mL/min (A) (by C-G formula based on SCr of 3.09 mg/dL (H)). Recent Labs  Lab 05/11/20 0424 05/12/20 0400 05/14/20 0500 05/15/20 0400  WBC 8.3 9.2 11.7* 15.4*    Liver Function Tests: Recent Labs  Lab 05/10/20 0200 05/11/20 0424 05/12/20 0400 05/14/20 0500  AST 23 38 47* 33  ALT 37 36 40 55*  ALKPHOS 51 50 49 56  BILITOT 0.8 0.8 0.6 0.8  PROT 5.9* 6.1* 5.6* 6.4*  ALBUMIN 3.0* 3.2* 3.0* 3.2*   No results for input(s): LIPASE, AMYLASE in the last 168 hours. No results for input(s): AMMONIA in the last 168 hours.  ABG    Component Value Date/Time   PHART 7.200 (L) 05/15/2020 1000   PCO2ART 57.6 (H) 05/15/2020 1000   PO2ART 74.7 (L) 05/15/2020 1000   HCO3 21.6 05/15/2020 1000   TCO2 31 05/14/2020 1147    ACIDBASEDEF 6.4 (H) 05/15/2020 1000   O2SAT 92.9 05/15/2020 1000     Coagulation Profile: No results for input(s): INR, PROTIME in the last 168 hours.  Cardiac Enzymes: No results for input(s): CKTOTAL, CKMB, CKMBINDEX, TROPONINI in the last 168 hours.  HbA1C: No results found for: HGBA1C  CBG: Recent Labs  Lab 05/15/20 1213 05/15/20 1524 05/15/20 1707 05/15/20 1957 05/15/20 2315  GLUCAP 160* 181* 177* 172* 135*   The patient is critically ill with multiple organ systems failure and requires high complexity decision making for assessment and support, frequent evaluation and titration of therapies, application of advanced monitoring technologies and extensive interpretation of multiple databases. Critical Care Time devoted to patient care services described in this note independent of APP/resident time (if applicable)  is 32 minutes.   Virl Diamond MD [facility] Personal pager: [phone] If unanswered, please page CCM On-call: [phone]

## 2020-05-16 NOTE — Progress Notes (Signed)
Spoke to e-link about concern for increase in heart rate. Physician reviewed AM chest xray and pneumothorax of the right lung appears to be resolved per his note, not yet resulted by radiology. Chest tube has been to water seal throughout night per physician order and has minimal output of only 20cc serous fluid. Sedation and pain relief appears to be adequate per BIS and there is no fluctuation in vital signs when titrated. PT temperature was increased to 99.1 at 5 am, I removed all but the sheet covering the patient. PT was hypothermic at start of shift and we were able to gradually re-warm her with blankets and increase in room temperature. I have lowered the room temperature as well and temperature has began decreasing again. Will continue to assess for change in status.

## 2020-05-16 NOTE — Progress Notes (Signed)
Updated patients husband via phone.Melodye Ped

## 2020-05-16 NOTE — Progress Notes (Addendum)
eLink Physician-Brief Progress Note Patient Name: Donna Campbell DOB: 1961-07-14 MRN: [id number]   Date of Service  05/16/2020  HPI/Events of Note  AM CxR.  HR 109.   eICU Interventions  Re expanded lungs from rt pig tail chest tube. Continue care. Follow official radiology reporting also.      Intervention Category Intermediate Interventions: Other:;Diagnostic test evaluation  Ranee Gosselin 05/16/2020, 5:29 AM   6:48 K stable. Cr 3. Continue care. Consider nephro help.

## 2020-05-17 ENCOUNTER — Inpatient Hospital Stay (HOSPITAL_COMMUNITY): Payer: BC Managed Care – PPO

## 2020-05-17 LAB — BASIC METABOLIC PANEL
Anion gap: 14 (ref 5–15)
Anion gap: 15 (ref 5–15)
BUN: 180 mg/dL — ABNORMAL HIGH (ref 6–20)
BUN: 175 mg/dL — ABNORMAL HIGH (ref 6–20)
CO2: 20 mmol/L — ABNORMAL LOW (ref 22–32)
CO2: 20 mmol/L — ABNORMAL LOW (ref 22–32)
Calcium: 7.8 mg/dL — ABNORMAL LOW (ref 8.9–10.3)
Calcium: 7.7 mg/dL — ABNORMAL LOW (ref 8.9–10.3)
Chloride: 111 mmol/L (ref 98–111)
Chloride: 111 mmol/L (ref 98–111)
Creatinine, Ser: 3.79 mg/dL — ABNORMAL HIGH (ref 0.44–1.00)
Creatinine, Ser: 4.39 mg/dL — ABNORMAL HIGH (ref 0.44–1.00)
GFR calc Af Amer: 14 mL/min — ABNORMAL LOW (ref >60)
GFR calc Af Amer: 12 mL/min — ABNORMAL LOW (ref >60)
GFR calc non Af Amer: 12 mL/min — ABNORMAL LOW (ref >60)
GFR calc non Af Amer: 10 mL/min — ABNORMAL LOW (ref >60)
Glucose, Bld: 177 mg/dL — ABNORMAL HIGH (ref 70–99)
Glucose, Bld: 204 mg/dL — ABNORMAL HIGH (ref 70–99)
Potassium: 5.1 mmol/L (ref 3.5–5.1)
Potassium: 5.6 mmol/L — ABNORMAL HIGH (ref 3.5–5.1)
Sodium: 145 mmol/L (ref 135–145)
Sodium: 146 mmol/L — ABNORMAL HIGH (ref 135–145)

## 2020-05-17 LAB — GLUCOSE, CAPILLARY
Glucose-Capillary: 155 mg/dL — ABNORMAL HIGH (ref 70–99)
Glucose-Capillary: 182 mg/dL — ABNORMAL HIGH (ref 70–99)
Glucose-Capillary: 163 mg/dL — ABNORMAL HIGH (ref 70–99)
Glucose-Capillary: 181 mg/dL — ABNORMAL HIGH (ref 70–99)
Glucose-Capillary: 224 mg/dL — ABNORMAL HIGH (ref 70–99)

## 2020-05-17 LAB — CBC
HCT: 28.8 % — ABNORMAL LOW (ref 36.0–46.0)
Hemoglobin: 8.8 g/dL — ABNORMAL LOW (ref 12.0–15.0)
MCH: 30 pg (ref 26.0–34.0)
MCHC: 30.6 g/dL (ref 30.0–36.0)
MCV: 98.3 fL (ref 80.0–100.0)
Platelets: 121 10*3/uL — ABNORMAL LOW (ref 150–400)
RBC: 2.93 MIL/uL — ABNORMAL LOW (ref 3.87–5.11)
RDW: 14.6 % (ref 11.5–15.5)
WBC: 9.6 10*3/uL (ref 4.0–10.5)
nRBC: 0.2 % (ref 0.0–0.2)

## 2020-05-17 LAB — BLOOD GAS, ARTERIAL
Acid-base deficit: 10.9 mmol/L — ABNORMAL HIGH (ref 0.0–2.0)
Bicarbonate: 16.6 mmol/L — ABNORMAL LOW (ref 20.0–28.0)
FIO2: 40
O2 Saturation: 78.7 %
Patient temperature: 97.7
pCO2 arterial: 45.4 mmHg (ref 32.0–48.0)
pH, Arterial: 7.185 — CL (ref 7.350–7.450)
pO2, Arterial: 51.8 mmHg — ABNORMAL LOW (ref 83.0–108.0)

## 2020-05-17 LAB — POTASSIUM: Potassium: 5.1 mmol/L (ref 3.5–5.1)

## 2020-05-17 MED ORDER — ALBUTEROL SULFATE (2.5 MG/3ML) 0.083% IN NEBU
5.0000 mg | INHALATION_SOLUTION | Freq: Once | RESPIRATORY_TRACT | Status: AC
  Administered 2020-05-17: 5 mg via RESPIRATORY_TRACT
  Filled 2020-05-17: qty 6

## 2020-05-17 MED ORDER — SODIUM BICARBONATE 8.4 % IV SOLN
INTRAVENOUS | Status: AC
  Filled 2020-05-17: qty 50

## 2020-05-17 MED ORDER — SODIUM ZIRCONIUM CYCLOSILICATE 5 G PO PACK
5.0000 g | PACK | Freq: Once | ORAL | Status: AC
  Administered 2020-05-17: 5 g
  Filled 2020-05-17: qty 1

## 2020-05-17 MED ORDER — SENNOSIDES 8.8 MG/5ML PO SYRP
5.0000 mL | ORAL_SOLUTION | Freq: Two times a day (BID) | ORAL | Status: DC
  Administered 2020-05-17 – 2020-05-20 (×7): 5 mL
  Filled 2020-05-17 (×8): qty 5

## 2020-05-17 MED ORDER — CALCIUM GLUCONATE-NACL 1-0.675 GM/50ML-% IV SOLN
1.0000 g | Freq: Once | INTRAVENOUS | Status: AC
  Administered 2020-05-17: 1000 mg via INTRAVENOUS
  Filled 2020-05-17: qty 50

## 2020-05-17 MED ORDER — STERILE WATER FOR INJECTION IV SOLN
INTRAVENOUS | Status: DC
  Filled 2020-05-17 (×2): qty 150
  Filled 2020-05-17: qty 850
  Filled 2020-05-17: qty 150
  Filled 2020-05-17: qty 850
  Filled 2020-05-17 (×2): qty 150

## 2020-05-17 MED ORDER — SODIUM BICARBONATE 8.4 % IV SOLN
100.0000 meq | Freq: Once | INTRAVENOUS | Status: AC
  Administered 2020-05-17: 100 meq via INTRAVENOUS
  Filled 2020-05-17: qty 50

## 2020-05-17 MED ORDER — DOCUSATE SODIUM 50 MG/5ML PO LIQD
100.0000 mg | Freq: Two times a day (BID) | ORAL | Status: DC
  Administered 2020-05-17 – 2020-05-20 (×7): 100 mg
  Filled 2020-05-17 (×7): qty 10

## 2020-05-17 MED ORDER — POLYETHYLENE GLYCOL 3350 17 G PO PACK
17.0000 g | PACK | Freq: Every day | ORAL | Status: DC
  Administered 2020-05-18 – 2020-05-20 (×3): 17 g
  Filled 2020-05-17 (×3): qty 1

## 2020-05-17 MED ORDER — INSULIN ASPART 100 UNIT/ML IV SOLN
10.0000 [IU] | Freq: Once | INTRAVENOUS | Status: AC
  Administered 2020-05-17: 10 [IU] via INTRAVENOUS

## 2020-05-17 MED ORDER — SODIUM CHLORIDE 0.45 % IV BOLUS
500.0000 mL | Freq: Once | INTRAVENOUS | Status: AC
  Administered 2020-05-17: 500 mL via INTRAVENOUS

## 2020-05-17 MED ORDER — DEXTROSE 5 % AND 0.45 % NACL IV BOLUS
500.0000 mL | Freq: Once | INTRAVENOUS | Status: AC
  Administered 2020-05-17: 500 mL via INTRAVENOUS

## 2020-05-17 MED ORDER — DEXTROSE-NACL 5-0.45 % IV SOLN: INTRAVENOUS | Status: DC

## 2020-05-17 MED ORDER — DEXTROSE 50 % IV SOLN
1.0000 | Freq: Once | INTRAVENOUS | Status: AC
  Administered 2020-05-17: 50 mL via INTRAVENOUS
  Filled 2020-05-17: qty 50

## 2020-05-17 NOTE — TOC Progression Note (Signed)
Transition of Care [facility]) - Progression Note    Patient Details  Name: Donna Campbell MRN: [id number] Date of Birth: December 20, 1960  Transition of Care [facility] CM/SW Contact  Golda Acre, RN Phone Number: 05/17/2020, 8:03 AM  Clinical Narrative:    Remains on the vent, sedated and paralyzed, left chest to water seal drainage due to hydrothorax.  Critical ill. To unstable to consider ltach placement at this time. Will follow for toc needs and progression.  Expected Discharge Plan: Home/Self Care Barriers to Discharge: Continued Medical Work up  Expected Discharge Plan and Services Expected Discharge Plan: Home/Self Care   Discharge Planning Services: CM Consult   Living arrangements for the past 2 months: Single Family Home                                       Social Determinants of Health (SDOH) Interventions    Readmission Risk Interventions No flowsheet data found.

## 2020-05-17 NOTE — Progress Notes (Addendum)
Nutrition Follow-up  DOCUMENTATION CODES:   Obesity unspecified  INTERVENTION:  - will monitor for plan concerning TF re-start.   NUTRITION DIAGNOSIS:   Increased nutrient needs related to acute illness, catabolic illness (COVID-19 infection) as evidenced by estimated needs. -ongoing  GOAL:   Provide needs based on ASPEN/SCCM guidelines -unmet  MONITOR:   Vent status, Labs, Weight trends, Skin, Other (Comment) (plan concerning TF)  ASSESSMENT:   Pt admitted with severe ARDS, COVID-19 pneumonia. PMH includes asthma, GERD.  Significant Events: 7/25- admission; intubation and OGT placement; initial RD assessment; TF initiation  8/1- aspiration event after vomiting TF while proned 8/2- patient assessed at bedside by RD--NFPE completed 8/5- aspiration event after large volume vomiting TF, copious amount of TF suctioned from mouth and ETT 8/6- attempts by Charge RN at bedside and Diagnostic Radiology using fluoro and C-arm unsuccessful in placing post-pyloric tube 8/8- large R-sided pneumothorax; chest tube placement 8/9- flipped to prone position   Patient returned to supine position earlier today. OGT remains in place.   Review of notes and imagining indicates that on 8/6 Charge RN at bedside attempting post-pyloric small bore tube placement unsuccessful. Diagnostic Radiology brought up C-arm later that date and attempted placement under fluoro. It appears that only R nare placement was attempted. Imaging report states difficulty advancing tube into the esophagus and that several times it tracked toward the trachea. Two guidewires were attempted without success and attempts aborted. No further attempts made to place tube.   Talked with Cindee Lame about unsuccessful post-pyloric tube attempts, patient with inconsistent TF, and inadequate nutrition for >1 week. Possible plan by CCM to attempt post-pyloric tube placement. CCM advises against TPN at this time.   Weight has been stable over  the past 3 days. Flow sheet documentation indicates mild pitting edema to BUE and moderate pitting edema to BLE.   Patient has not had a BM since 8/5 despite aggressive bowel regimen.    Patient is currently intubated on ventilator support MV: 11.9 L/min Temp (24hrs), Avg:96.6 F (35.9 C), Min:95.9 F (35.5 C), Max:97.9 F (36.6 C) Propofol: none BP: 151/66 and MAP: 93  Labs reviewed; CBG: 182 mg/dl, BUN: 160 mg/dl, creatinine: 7.37 mg/dl, Ca: 7.8 mg/dl, GFR: 12 ml/min.  Medications reviewed; 100 mg colace BID, sliding scale novolog, 25 units levemir BID, 60 mg solu-medrol BID, 15 ml multivitamin with minerals/day, 17 g miralax/day, 5 ml senokot BID.  IVF; D5-1/2 NS @ 100 ml/hr (408 kcal). Drips; nimbex: 3 mcg/kg/min, versed @ 5 mg/hr.    Diet Order:   Diet Order            Diet NPO time specified  Diet effective now                 EDUCATION NEEDS:   No education needs have been identified at this time  Skin:  Skin Assessment: Skin Integrity Issues: Skin Integrity Issues:: DTI DTI: R forehead (newly documented 8/8; RN reports due to leads on forehead while proning)  Last BM:  8/5  Height:   Ht Readings from Last 1 Encounters:  05/04/20 5\' 4"  (1.626 m)    Weight:   Wt Readings from Last 1 Encounters:  05/17/20 100.8 kg    Estimated Nutritional Needs:  Kcal:  2400 kcal Protein:  136-152 grams Fluid:  >/= 2.5 L/day     07/17/20, MS, RD, LDN, CNSC Inpatient Clinical Dietitian RD pager # available in [facility]  After hours/weekend pager # available in [facility]

## 2020-05-17 NOTE — Progress Notes (Addendum)
NAME:  Donna Campbell, MRN:  [id number], DOB:  March 21, 1961, LOS: 71 ADMISSION DATE:  04/13/2020, CONSULTATION DATE: May 01, 2020 REFERRING MD: EDP, CHIEF COMPLAINT: Dyspnea  Brief History   59 year old female presented to the [facility] long emergency room with dyspnea on May 01, 2020.  She had been diagnosed with COVID on April 26, 2020 at an urgent care.  She was intubated in the evening of July 25.  Past Medical History  GERD Asthma  Significant Hospital Events   July 25 admission/intubation, prone, paralyzed 7/26 NSTEMI July 28-30 recurrent vomiting while prone 8/2 prone held 8/5 Bradycardia events overnight, ventilator changes made, versed drip held> cough, vomit, aspirated 8/7 refractory hypoxia with saturations in low 80s 8/8 large right-sided pneumothorax-chest tube placed with resolution  Consults:    Procedures:  ETT 7/25 >  Left IJ CVL 7/25 >  8/8 chest tube placement  Significant Diagnostic Tests:  7/27 Echo > LVEF 05-39%, grade 1 diastolic dystolic, RV size/function normal, valves OK 8/5 Echo> LVEF WNL, RV normal size/function, valves OK  Micro Data:  July 20 SARS-CoV-2 positive 7/25 blood > negative 7/31 resp > MRSA 8/6 resp culture > abundant WBC, mostly PMN, abundant GPC  Antimicrobials/COVID treatment  July 25 remdesivir>  July 25 Tocilizumab> July 26 July 25 Solu-Medrol 60 mg every 12 hours>  7/31 cefepime >> 8/2-8/9 7/31 vanc >>, 8/6>>  Interim history/subjective:  Oxygen saturations slowly increased overnight, remains on 90% with NMB, remains prone at present Pneumothorax resolved on chest x-ray  Objective   Blood pressure (!) 131/59, pulse 80, temperature 97.9 F (36.6 C), temperature source Oral, resp. rate (!) 32, height '5\' 4"'  (1.626 m), weight 100.8 kg, last menstrual period 07/17/2012, SpO2 97 %.    Vent Mode: PRVC FiO2 (%):  [70 %] 70 % Set Rate:  [32 bmp] 32 bmp Vt Set:  [380 mL] 380 mL PEEP:  [10 cmH20] 10 cmH20 Plateau Pressure:  [19  cmH20-25 cmH20] 22 cmH20   Intake/Output Summary (Last 24 hours) at 05/17/2020 0834 Last data filed at 05/17/2020 0500 Gross per 24 hour  Intake 1732.11 ml  Output 1735 ml  Net -2.89 ml   Filed Weights   05/15/20 0410 05/16/20 0455 05/17/20 0429  Weight: 100.4 kg 100.8 kg 100.8 kg    Examination: General: Proned, on neuromuscular blockade HENT: Anicteric PULM: Clear to auscultation posteriorly.  No significant secretions  CV: S1-S2 appreciated with no murmurs GI: Obese, soft, nondistended MSK: Normal muscle mass for age, no peripheral edema.  Both hands are cold, but feet warm. Neuro: RASS -5, paralyzed chemically.   Resolved Hospital Problem list     Assessment & Plan:   Severe ARDS due to COVID 19 pneumonia > worsening 8/6 after aspiration event MRSA pneumonia-persistent -Continue low tidal volume ventilation, 4-8 cc/kg ideal body weight.  Not meeting goal.  Maintain driving pressure goal of about 15.  Vent adjustments made. Permissive hypercapnia -Continue Solu-Medrol to 60 mg twice daily -VAP prevention protocol -Continue broad-spectrum antibiotics-cefepime and vancomycin -Continue to follow repeat tracheal aspirate culture-MRSA on 8/6 -Has had about 10 days of vancomycin -Will stop vancomycin at present -If family wishes to continue aggressive care, likely tracheostomy is in her future. -We will unprone today to reduce the risks of skin breakdown. -Hold off on paralytic today  Recurrent aspiration events (both supine and prone) -holding enteral feeds currently; has high residuals every time they were checked after free water or oral meds were given and the tube is clamped. -  Will consider resuming feeding once off paralytics  R Arm cool to touch, cap refill intact, pulses noted by doppler and weakly palpable -Continue to monitor -May need bilateral radial ultrasounds  Pneumothorax -Resolved with chest tube placement -Continue to monitor   Bradycardia> 12 lead  with RBBB and possible RV strain pattern, PE? 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  Vent dyssynchrony, severe > worsened significantly on 8/5 when versed drip held> cough, vomit, aspirated  Need for sedation for mechanical ventilation  -Continue Dilaudid infusion, versed infusion  -Will hold neuromuscular blockade today  Hyperglycemia-controlled -Continue detemir and SSI -Goal BG 140-180  AKI > recurrent.  With undetectable sodium this is likely prerenal. Hypernatremia > improved 8/5 Hyperkalemia; mildly peaked lateral T waves. -Monitor BMET and UOP -Replace electrolytes as needed -Decrease enteral free water and supplement with D5W IV -Current potassium is 5.1 -Will increase fluid  Thrombocytopenia/anemia: -Continue to monitor -Transfuse PRBC for Hgb < 7 gm/dL  Goals of care: full code; husband wants everything done  Maintain chest tube to water seal Follow cxr Risk of decompensation remains very high Worsening renal function Increase fluids, repeat be met at 1500 No acute need for renal replacement therapy. FiO2 down to 50%   Best practice:  Diet: tube feeding per protocol Pain/Anxiety/Delirium protocol (if indicated): as above VAP protocol (if indicated): yes DVT prophylaxis: lovenox GI prophylaxis: Pantoprazole for stress ulcer prophylaxis Glucose control: as above Mobility: bed rest Code Status: full Family Communication: Husband Richard updated by phone on 8/8- understands that today is a significant setback Disposition: remain in ICU  Labs   CBC: Recent Labs  Lab 05/11/20 0424 05/11/20 0424 05/12/20 0400 05/12/20 0400 05/12/20 2316 05/14/20 0500 05/14/20 1147 05/15/20 0400 05/17/20 0452  WBC 8.3  --  9.2  --   --  11.7*  --  15.4* 9.6  NEUTROABS 6.9  --  8.0*   --   --  10.1*  --  14.1*  --   HGB 11.3*   < > 10.5*   < > 11.9* 12.1 11.9* 11.7* 8.8*  HCT 37.0   < > 34.6*   < > 35.0* 39.9 35.0* 41.0 28.8*  MCV 99.2  --  98.0  --   --  98.5  --  103.0* 98.3  PLT 147*  --  125*  --   --  162  --  170 121*   < > = values in this interval not displayed.    Basic Metabolic Panel: Recent Labs  Lab 05/12/20 0400 05/12/20 2316 05/13/20 0500 05/13/20 0500 05/14/20 0500 05/14/20 0500 05/14/20 1147 05/14/20 1147 05/15/20 0400 05/15/20 0601 05/16/20 0201 05/16/20 0530 05/16/20 1141 05/16/20 1401 05/17/20 0452  NA 144   < > 146*   < > 152*  --  153*  --  150*  --   --  146*  --   --  145  K 4.3   < > 4.7   < > 4.1   < > 4.5   < > 5.5*   < > 5.4* 5.2* 5.0 5.2* 5.1  CL 112*  --  109  --  115*  --   --   --  115*  --   --  113*  --   --  111  CO2 29  --  27  --  27  --   --   --  23  --   --  23  --   --  20*  GLUCOSE 95  --  159*  --  127*  --   --   --  187*  --   --  135*  --   --  177*  BUN 73*  --  80*  --  89*  --   --   --  113*  --   --  143*  --   --  180*  CREATININE 1.02*  --  1.11*  --  1.20*  --   --   --  2.40*  --   --  3.09*  --   --  3.79*  CALCIUM 10.1  --  9.7  --  9.3  --   --   --  8.9  --   --  8.1*  --   --  7.8*  MG 2.4  --  2.6*  --   --   --   --   --   --   --   --   --   --   --   --    < > = values in this interval not displayed.   GFR: Estimated Creatinine Clearance: 18.7 mL/min (A) (by C-G formula based on SCr of 3.79 mg/dL (H)). Recent Labs  Lab 05/12/20 0400 05/14/20 0500 05/15/20 0400 05/17/20 0452  WBC 9.2 11.7* 15.4* 9.6    Liver Function Tests: Recent Labs  Lab 05/11/20 0424 05/12/20 0400 05/14/20 0500  AST 38 47* 33  ALT 36 40 55*  ALKPHOS 50 49 56  BILITOT 0.8 0.6 0.8  PROT 6.1* 5.6* 6.4*  ALBUMIN 3.2* 3.0* 3.2*   No results for input(s): LIPASE, AMYLASE in the last 168 hours. No results for input(s): AMMONIA in the last 168 hours.  ABG    Component Value Date/Time   PHART 7.246 (L)  05/16/2020 1528   PCO2ART 47.1 05/16/2020 1528   PO2ART 97.8 05/16/2020 1528   HCO3 19.7 (L) 05/16/2020 1528   TCO2 31 05/14/2020 1147   ACIDBASEDEF 6.8 (H) 05/16/2020 1528   O2SAT 96.7 05/16/2020 1528     Coagulation Profile: No results for input(s): INR, PROTIME in the last 168 hours.  Cardiac Enzymes: No results for input(s): CKTOTAL, CKMB, CKMBINDEX, TROPONINI in the last 168 hours.  HbA1C: No results found for: HGBA1C  CBG: Recent Labs  Lab 05/16/20 1945 05/16/20 2103 05/16/20 2300 05/17/20 0427 05/17/20 0750  GLUCAP 176* 172* 151* 155* 182*   The patient is critically ill with multiple organ systems failure and requires high complexity decision making for assessment and support, frequent evaluation and titration of therapies, application of advanced monitoring technologies and extensive interpretation of multiple databases. Critical Care Time devoted to patient care services described in this note independent of APP/resident time (if applicable)  is 35 minutes.   Sherrilyn Rist MD [facility] Personal pager: [phone] If unanswered, please page CCM On-call: [phone]

## 2020-05-17 NOTE — Progress Notes (Signed)
eLink Physician-Brief Progress Note Patient Name: Donna Campbell DOB: 04-09-1961 MRN: [id number]   Date of Service  05/17/2020  HPI/Events of Note  ABG on 40%/PRVC 32/TV 380/P 10 = 7.185/45.4/51.8  eICU Interventions  Plan: 1. NaHCO3 100 meq IV now.  2. NaHCO3 IV infusion to run IV at 75 mL/hour.  3. Repeat ABG at 12 midnight.     Intervention Category Major Interventions: Acid-Base disturbance - evaluation and management;Respiratory failure - evaluation and management  Lenell Antu 05/17/2020, 9:26 PM

## 2020-05-18 ENCOUNTER — Encounter (HOSPITAL_COMMUNITY): Payer: Self-pay | Admitting: Pulmonary Disease

## 2020-05-18 ENCOUNTER — Inpatient Hospital Stay (HOSPITAL_COMMUNITY): Payer: BC Managed Care – PPO

## 2020-05-18 ENCOUNTER — Inpatient Hospital Stay: Payer: Self-pay

## 2020-05-18 DIAGNOSIS — J1282 Pneumonia due to coronavirus disease 2019: Secondary | ICD-10-CM

## 2020-05-18 DIAGNOSIS — J9383 Other pneumothorax: Secondary | ICD-10-CM

## 2020-05-18 LAB — GLUCOSE, CAPILLARY
Glucose-Capillary: 116 mg/dL — ABNORMAL HIGH (ref 70–99)
Glucose-Capillary: 125 mg/dL — ABNORMAL HIGH (ref 70–99)
Glucose-Capillary: 150 mg/dL — ABNORMAL HIGH (ref 70–99)
Glucose-Capillary: 171 mg/dL — ABNORMAL HIGH (ref 70–99)
Glucose-Capillary: 188 mg/dL — ABNORMAL HIGH (ref 70–99)
Glucose-Capillary: 188 mg/dL — ABNORMAL HIGH (ref 70–99)
Glucose-Capillary: 95 mg/dL (ref 70–99)

## 2020-05-18 LAB — URINALYSIS, ROUTINE W REFLEX MICROSCOPIC
Bilirubin Urine: NEGATIVE
Glucose, UA: NEGATIVE mg/dL
Ketones, ur: NEGATIVE mg/dL
Nitrite: NEGATIVE
Protein, ur: 100 mg/dL — AB
RBC / HPF: >50 RBC/hpf — ABNORMAL HIGH (ref 0–5)
Specific Gravity, Urine: 1.012 (ref 1.005–1.030)
WBC, UA: >50 WBC/hpf — ABNORMAL HIGH (ref 0–5)
pH: 5 (ref 5.0–8.0)

## 2020-05-18 LAB — BASIC METABOLIC PANEL
Anion gap: 16 — ABNORMAL HIGH (ref 5–15)
BUN: 144 mg/dL — ABNORMAL HIGH (ref 6–20)
CO2: 21 mmol/L — ABNORMAL LOW (ref 22–32)
Calcium: 7.4 mg/dL — ABNORMAL LOW (ref 8.9–10.3)
Chloride: 107 mmol/L (ref 98–111)
Creatinine, Ser: 4.47 mg/dL — ABNORMAL HIGH (ref 0.44–1.00)
GFR calc Af Amer: 12 mL/min — ABNORMAL LOW (ref >60)
GFR calc non Af Amer: 10 mL/min — ABNORMAL LOW (ref >60)
Glucose, Bld: 193 mg/dL — ABNORMAL HIGH (ref 70–99)
Potassium: 5.3 mmol/L — ABNORMAL HIGH (ref 3.5–5.1)
Sodium: 144 mmol/L (ref 135–145)

## 2020-05-18 LAB — CBC WITH DIFFERENTIAL/PLATELET
Abs Immature Granulocytes: 0.1 10*3/uL — ABNORMAL HIGH (ref 0.00–0.07)
Basophils Absolute: 0 10*3/uL (ref 0.0–0.1)
Basophils Relative: 0 %
Eosinophils Absolute: 0 10*3/uL (ref 0.0–0.5)
Eosinophils Relative: 0 %
HCT: 25.6 % — ABNORMAL LOW (ref 36.0–46.0)
Hemoglobin: 8 g/dL — ABNORMAL LOW (ref 12.0–15.0)
Immature Granulocytes: 1 %
Lymphocytes Relative: 5 %
Lymphs Abs: 0.4 10*3/uL — ABNORMAL LOW (ref 0.7–4.0)
MCH: 30 pg (ref 26.0–34.0)
MCHC: 31.3 g/dL (ref 30.0–36.0)
MCV: 95.9 fL (ref 80.0–100.0)
Monocytes Absolute: 0.4 10*3/uL (ref 0.1–1.0)
Monocytes Relative: 5 %
Neutro Abs: 6.1 10*3/uL (ref 1.7–7.7)
Neutrophils Relative %: 89 %
Platelets: 101 10*3/uL — ABNORMAL LOW (ref 150–400)
RBC: 2.67 MIL/uL — ABNORMAL LOW (ref 3.87–5.11)
RDW: 14.8 % (ref 11.5–15.5)
WBC: 6.9 10*3/uL (ref 4.0–10.5)
nRBC: 0.4 % — ABNORMAL HIGH (ref 0.0–0.2)

## 2020-05-18 LAB — PHOSPHORUS: Phosphorus: 9.5 mg/dL — ABNORMAL HIGH (ref 2.5–4.6)

## 2020-05-18 LAB — BLOOD GAS, ARTERIAL
Acid-base deficit: 5.8 mmol/L — ABNORMAL HIGH (ref 0.0–2.0)
Bicarbonate: 21 mmol/L (ref 20.0–28.0)
FIO2: 60
O2 Saturation: 88 %
Patient temperature: 98.6
pCO2 arterial: 50.6 mmHg — ABNORMAL HIGH (ref 32.0–48.0)
pH, Arterial: 7.241 — ABNORMAL LOW (ref 7.350–7.450)
pO2, Arterial: 61.8 mmHg — ABNORMAL LOW (ref 83.0–108.0)

## 2020-05-18 LAB — CREATININE, URINE, RANDOM: Creatinine, Urine: 63.63 mg/dL

## 2020-05-18 LAB — SODIUM, URINE, RANDOM: Sodium, Ur: 11 mmol/L

## 2020-05-18 LAB — MAGNESIUM: Magnesium: 3.3 mg/dL — ABNORMAL HIGH (ref 1.7–2.4)

## 2020-05-18 LAB — VANCOMYCIN, RANDOM: Vancomycin Rm: 37

## 2020-05-18 MED ORDER — LABETALOL HCL 5 MG/ML IV SOLN
10.0000 mg | INTRAVENOUS | Status: DC | PRN
  Administered 2020-05-18 – 2020-05-20 (×4): 10 mg via INTRAVENOUS
  Filled 2020-05-18 (×6): qty 4

## 2020-05-18 MED ORDER — FUROSEMIDE 10 MG/ML IJ SOLN: 40.0000 mg | Freq: Once | INTRAMUSCULAR | Status: AC

## 2020-05-18 MED ORDER — SODIUM CHLORIDE 0.9% FLUSH: 10.0000 mL | INTRAVENOUS | Status: DC | PRN

## 2020-05-18 MED ORDER — SODIUM CHLORIDE 0.9% FLUSH
10.0000 mL | Freq: Two times a day (BID) | INTRAVENOUS | Status: DC
  Administered 2020-05-18: 20 mL
  Administered 2020-05-19 (×2): 10 mL
  Administered 2020-05-20: 20 mL
  Administered 2020-05-20: 10 mL

## 2020-05-18 MED ORDER — METOCLOPRAMIDE HCL 5 MG/ML IJ SOLN
5.0000 mg | Freq: Four times a day (QID) | INTRAMUSCULAR | Status: DC
  Administered 2020-05-18 – 2020-05-21 (×11): 5 mg via INTRAVENOUS
  Filled 2020-05-18 (×11): qty 2

## 2020-05-18 MED ORDER — VITAL HIGH PROTEIN PO LIQD
1000.0000 mL | ORAL | Status: DC
  Administered 2020-05-18: 1000 mL

## 2020-05-18 MED ORDER — ARTIFICIAL TEARS OPHTHALMIC OINT
1.0000 "application " | TOPICAL_OINTMENT | Freq: Three times a day (TID) | OPHTHALMIC | Status: DC
  Filled 2020-05-18: qty 3.5

## 2020-05-18 MED ORDER — FUROSEMIDE 10 MG/ML IJ SOLN
INTRAMUSCULAR | Status: AC
  Administered 2020-05-18: 40 mg via INTRAVENOUS
  Filled 2020-05-18: qty 4

## 2020-05-18 MED ORDER — SODIUM CHLORIDE 0.9 % IV SOLN
0.0000 ug/kg/min | INTRAVENOUS | Status: DC
  Administered 2020-05-18 – 2020-05-19 (×3): 3 ug/kg/min via INTRAVENOUS
  Administered 2020-05-20: 2.5 ug/kg/min via INTRAVENOUS
  Administered 2020-05-20: 3.5 ug/kg/min via INTRAVENOUS
  Filled 2020-05-18 (×12): qty 20

## 2020-05-18 MED ORDER — PROSOURCE TF PO LIQD
45.0000 mL | Freq: Two times a day (BID) | ORAL | Status: DC
  Administered 2020-05-18: 45 mL
  Filled 2020-05-18 (×2): qty 45

## 2020-05-18 MED ORDER — CISATRACURIUM BOLUS VIA INFUSION
0.0500 mg/kg | Freq: Once | INTRAVENOUS | Status: AC
  Administered 2020-05-18: 5.2 mg via INTRAVENOUS
  Filled 2020-05-18: qty 6

## 2020-05-18 NOTE — Progress Notes (Signed)
NAME:  Donna Campbell, MRN:  [id number], DOB:  06-03-1961, LOS: 17 ADMISSION DATE:  05/02/2020, CONSULTATION DATE: May 01, 2020 REFERRING MD: EDP, CHIEF COMPLAINT: Dyspnea  Brief History   59 year old female presented to the [facility] long emergency room with dyspnea on May 01, 2020.  She had been diagnosed with COVID on April 26, 2020 at an urgent care.  She was intubated in the evening of July 25.  Past Medical History  GERD Asthma  Significant Hospital Events   July 25 admission/intubation, prone, paralyzed 7/26 NSTEMI July 28-30 recurrent vomiting while prone 8/2 prone held 8/5 Bradycardia events overnight, ventilator changes made, versed drip held> cough, vomit, aspirated 8/7 refractory hypoxia with saturations in low 80s 8/8 large right-sided pneumothorax-chest tube placed with resolution 8/11 small apical pneumothorax is stable  Consults:   8/11 consulted renal  Procedures:  ETT 7/25 >  Left IJ CVL 7/25 >  8/8 chest tube placement  Significant Diagnostic Tests:  7/27 Echo > LVEF 45-50%, grade 1 diastolic dystolic, RV size/function normal, valves OK 8/5 Echo> LVEF WNL, RV normal size/function, valves OK  Micro Data:  July 20 SARS-CoV-2 positive 7/25 blood > negative 7/31 resp > MRSA 8/6 resp culture > abundant WBC, mostly PMN, abundant GPC  Antimicrobials/COVID treatment  July 25 remdesivir>  July 25 Tocilizumab> July 26 July 25 Solu-Medrol 60 mg every 12 hours>  7/31 cefepime >> 8/2-8/8 7/31 vanc >> 8/6-8/10  Interim history/subjective:  Oxygen saturations slowly increased overnight, remains on 90% with NMB, Pneumothorax -small apical pneumothorax-chest tube still to waterseal A little worse overnight Objective   Blood pressure (!) 164/65, pulse 85, temperature 97.9 F (36.6 C), temperature source Oral, resp. rate (!) 21, height 5\' 4"  (1.626 m), weight 104.6 kg, last menstrual period 07/17/2012, SpO2 90 %.    Vent Mode: PRVC FiO2 (%):  [40 %-80 %] 80 % Set  Rate:  [32 bmp] 32 bmp Vt Set:  [380 mL] 380 mL PEEP:  [10 cmH20] 10 cmH20 Plateau Pressure:  [20 cmH20-25 cmH20] 21 cmH20   Intake/Output Summary (Last 24 hours) at 05/18/2020 1005 Last data filed at 05/18/2020 0700 Gross per 24 hour  Intake 3588.2 ml  Output 1725 ml  Net 1863.2 ml   Filed Weights   05/16/20 0455 05/17/20 0429 05/18/20 0500  Weight: 100.8 kg 100.8 kg 104.6 kg    Examination: General: On neuromuscular blockade as needed, HENT: Anicteric PULM: Clear to auscultation posteriorly.  No significant secretions CV: Tachycardic, regular rhythm, no murmurs GI: Obese, soft, nondistended MSK: Normal muscle mass for age, no peripheral edema.  Both hands are cold, but feet warm. Neuro: RASS -5  Resolved Hospital Problem list     Assessment & Plan:   Severe ARDS due to COVID 19 pneumonia > worsening 8/6 after aspiration event MRSA pneumonia-persistent-completed antibiotics -Continue low tidal volume ventilation, 4-8 cc/kg ideal body weight.  Not meeting goal.  Maintain driving pressure goal of about 15.  Vent adjustments made. Permissive hypercapnia -Continue Solu-Medrol to 60 mg twice daily -VAP prevention protocol -Continue broad-spectrum antibiotics-cefepime and vancomycin -Continue to follow repeat tracheal aspirate culture-MRSA on 8/6 -If family wishes to continue aggressive care, likely tracheostomy is in her future. -We will resume proning secondary to worsening  Recurrent aspiration events (both supine and prone) -holding enteral feeds currently; has high residuals every time they were checked after free water or oral meds were given and the tube is clamped.  R Arm cool to touch, cap refill intact, pulses noted  by doppler and weakly palpable -Continue to monitor  Pneumothorax -Resolved with chest tube placement -Continue to monitor  Bradycardia> 12 lead with RBBB and possible RV strain pattern, PE? 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  Vent dyssynchrony, severe > worsened significantly on 8/5 when versed drip held> cough, vomit, aspirated Need for sedation for mechanical ventilation  -NMB, goal RASS -4 to -5 -Continue Dilaudid infusion, versed infusion   Hyperglycemia-controlled -Continue detemir and SSI -Goal BG 140-180  AKI > recurrent.  With undetectable sodium this is likely prerenal. Hypernatremia > improved 8/5 Hyperkalemia; mildly peaked lateral T waves. -Monitor BMET and UOP -Replace electrolytes as needed -Decrease enteral free water and supplement with D5W IV -Current potassium is 5.3  Thrombocytopenia/anemia: -Continue to monitor -Transfuse PRBC for Hgb < 7 gm/dL  Goals of care: full code; husband wants everything done Attempted to reach husband today  We will resume proning For paralytic Prognosis is poor Hoping for renal recovery-appreciate renal input  Best practice:  Diet: tube feeding per protocol Pain/Anxiety/Delirium protocol (if indicated): as above VAP protocol (if indicated): yes DVT prophylaxis: lovenox GI prophylaxis: Pantoprazole for stress ulcer prophylaxis Glucose control: as above Mobility: bed rest Code Status: full Family Communication: Husband Richard updated by phone on 8/8- understands that today is a significant setback, attempted to update husband this morning, will call back-8/11 Disposition: remain in ICU  Labs   CBC: Recent Labs  Lab 05/12/20 0400 05/12/20 2316 05/14/20 0500 05/14/20 1147 05/15/20 0400 05/17/20 0452 05/18/20 0237  WBC 9.2  --  11.7*  --  15.4* 9.6 6.9  NEUTROABS 8.0*  --  10.1*  --  14.1*  --  6.1  HGB 10.5*   < > 12.1 11.9* 11.7* 8.8* 8.0*  HCT 34.6*   < > 39.9 35.0* 41.0 28.8* 25.6*  MCV 98.0  --  98.5  --  103.0* 98.3 95.9  PLT 125*  --  162  --  170 121* 101*    < > = values in this interval not displayed.    Basic Metabolic Panel: Recent Labs  Lab 05/12/20 0400 05/12/20 2316 05/13/20 0500 05/14/20 0500 05/15/20 0400 05/15/20 0601 05/16/20 0530 05/16/20 1141 05/16/20 1401 05/17/20 0452 05/17/20 1546 05/17/20 2046 05/18/20 0237  NA 144   < > 146*   < > 150*  --  146*  --   --  145 146*  --  144  K 4.3   < > 4.7   < > 5.5*   < > 5.2*   < > 5.2* 5.1 5.6* 5.1 5.3*  CL 112*  --  109   < > 115*  --  113*  --   --  111 111  --  107  CO2 29  --  27   < > 23  --  23  --   --  20* 20*  --  21*  GLUCOSE 95  --  159*   < > 187*  --  135*  --   --  177* 204*  --  193*  BUN 73*  --  80*   < > 113*  --  143*  --   --  180* 175*  --  144*  CREATININE 1.02*  --  1.11*   < > 2.40*  --  3.09*  --   --  3.79* 4.39*  --  4.47*  CALCIUM 10.1  --  9.7   < > 8.9  --  8.1*  --   --  7.8* 7.7*  --  7.4*  MG 2.4  --  2.6*  --   --   --   --   --   --   --   --   --   --    < > = values in this interval not displayed.   GFR: Estimated Creatinine Clearance: 16.2 mL/min (A) (by C-G formula based on SCr of 4.47 mg/dL (H)). Recent Labs  Lab 05/14/20 0500 05/15/20 0400 05/17/20 0452 05/18/20 0237  WBC 11.7* 15.4* 9.6 6.9    Liver Function Tests: Recent Labs  Lab 05/12/20 0400 05/14/20 0500  AST 47* 33  ALT 40 55*  ALKPHOS 49 56  BILITOT 0.6 0.8  PROT 5.6* 6.4*  ALBUMIN 3.0* 3.2*   No results for input(s): LIPASE, AMYLASE in the last 168 hours. No results for input(s): AMMONIA in the last 168 hours.  ABG    Component Value Date/Time   PHART 7.241 (L) 05/17/2020 2355   PCO2ART 50.6 (H) 05/17/2020 2355   PO2ART 61.8 (L) 05/17/2020 2355   HCO3 21.0 05/17/2020 2355   TCO2 31 05/14/2020 1147   ACIDBASEDEF 5.8 (H) 05/17/2020 2355   O2SAT 88.0 05/17/2020 2355     Coagulation Profile: No results for input(s): INR, PROTIME in the last 168 hours.  Cardiac Enzymes: No results for input(s): CKTOTAL, CKMB, CKMBINDEX, TROPONINI in the last 168  hours.  HbA1C: No results found for: HGBA1C  CBG: Recent Labs  Lab 05/17/20 1531 05/17/20 2041 05/18/20 0042 05/18/20 0348 05/18/20 0805  GLUCAP 181* 224* 188* 171* 188*   The patient is critically ill with multiple organ systems failure and requires high complexity decision making for assessment and support, frequent evaluation and titration of therapies, application of advanced monitoring technologies and extensive interpretation of multiple databases. Critical Care Time devoted to patient care services described in this note independent of APP/resident time (if applicable)  is 35 minutes.   Virl Diamond MD  Pulmonary Critical Care Personal pager: [phone] If unanswered, please page CCM On-call: #[phone]

## 2020-05-18 NOTE — Consult Note (Signed)
Renal Service Consult Note [facility]  Donna Campbell 05/18/2020 Donna Campbell Requesting Physician:  Dr Wynona Neat, A.   Reason for Consult:  Renal failure HPI: The patient is a 59 y.o. year-old w/ hx of GRED, asthma, allergy and obesity presented w/ SOB on 7/31 in ED w/ sats in the 30's.  CXR showed bilat infiltrates and pt was admitted w/ dx of COVID PNA w/ severe ARDS.  She rec'd usual Rx w/ IV steorids, remdesivir and Actemra. Has remained vent-dependent. Creat 1.3 on admit 7/25 which improved to 1.0 on 8/5. On 8/8 creat started to rise to 2.4 and was up to 3.7 yest and 4.4 today.  She had been on IV vanc for +mrsa in resp culture starting 7/31 , it was stopped 8/9 due to rising creat.  She rec'd IV lasix from 8/5 - 8/9.   UOP was 1-2 L day prior to AKI, then decreased to 300 - 600 cc/d , and yest was up to 900 cc / d.  Asked to see for renal failure.   Pt on vent, no ROS obtained.   RO - n/a    Past Medical History  Past Medical History:  Diagnosis Date   Allergy    Asthma    GERD (gastroesophageal reflux disease)    Shortness of breath    sometimes on exertion   Past Surgical History  Past Surgical History:  Procedure Laterality Date   CHOLECYSTECTOMY     COLONOSCOPY     SALPINGOOPHORECTOMY  07/30/2012   Procedure: SALPINGO OOPHERECTOMY;  Surgeon: Dorien Chihuahua. Richardson Dopp, MD;  Location: [facility];  Service: Gynecology;  Laterality: Left;   TONSILLECTOMY     VAGINAL HYSTERECTOMY  07/30/2012   Procedure: HYSTERECTOMY VAGINAL;  Surgeon: Dorien Chihuahua. Richardson Dopp, MD;  Location: [facility];  Service: Gynecology;  Laterality: N/A;   VARICOSE VEIN SURGERY     Family History History reviewed. No pertinent family history. Social History  reports that she has never smoked. She has never used smokeless tobacco. She reports that she does not drink alcohol and does not use drugs. Allergies  Allergies  Allergen Reactions   Naproxen Hives    Pt says she can take ibuprofen    Sulfonamide Derivatives Hives   Home medications Prior to Admission medications   Medication Sig Start Date End Date Taking? Authorizing Provider  albuterol (PROVENTIL HFA;VENTOLIN HFA) 108 (90 BASE) MCG/ACT inhaler Inhale 2 puffs into the lungs every 4 (four) hours as needed. For shortness of breath   Yes [provider]  ASHWAGANDHA PO Take 2 tablets by mouth daily.   Yes [provider]  cholecalciferol (VITAMIN D) 1000 UNITS tablet Take 1,000 Units by mouth daily.   Yes [provider]  Coenzyme Q-10 100 MG capsule Take 100 mg by mouth daily.   Yes [provider]  estradiol (ESTRACE) 1 MG tablet Take 1 mg by mouth daily. 03/21/20  Yes [provider]  famotidine (PEPCID) 20 MG tablet Take 20 mg by mouth 2 (two) times daily. 01/30/20  Yes [provider]  Ginkgo Biloba (GINKOBA) 40 MG TABS Take 1 tablet by mouth daily.   Yes [provider]  Maca 500 MG CAPS Take 1 capsule by mouth daily.   Yes [provider]  Menaquinone-7 (VITAMIN K2 PO) Take 1 tablet by mouth daily.   Yes [provider]  Multiple Vitamins-Minerals (ZINC PO) Take 1 tablet by mouth daily.   Yes [provider]  simvastatin (ZOCOR)  40 MG tablet Take 40 mg by mouth every evening.   Yes [provider]  venlafaxine XR (EFFEXOR-XR) 75 MG 24 hr capsule Take 75 mg by mouth daily. 04/27/20  Yes [provider]  calcium carbonate (OS-CAL) 600 MG TABS Take 600 mg by mouth daily. Patient not taking: Reported on 04/28/2020    [provider]  cetirizine (ZYRTEC) 10 MG tablet Take 10 mg by mouth at bedtime. Patient not taking: Reported on 04/30/2020    [provider]  Cranberry 400 MG CAPS Take 2,400 mg by mouth daily. Patient not taking: Reported on 05/04/2020    [provider]  ibuprofen (ADVIL,MOTRIN) 600 MG tablet Take 1 tablet (600 mg total) by mouth every 6 (six) hours as needed. Patient not  taking: Reported on 02/15/2015 07/31/12   Geryl Rankins, MD  ipratropium (ATROVENT) 0.03 % nasal spray Place 2 sprays into both nostrils 2 (two) times daily. Patient not taking: Reported on 05/07/2020 02/15/15   Trena Platt D, PA  Multiple Vitamin (MULTIVITAMIN WITH MINERALS) TABS Take 1 tablet by mouth daily. Patient not taking: Reported on 04/28/2020    [provider]  nitrofurantoin (MACRODANTIN) 100 MG capsule Take 100 mg by mouth 4 (four) times daily. Patient not taking: Reported on 05/07/2020    [provider]  oxyCODONE-acetaminophen (PERCOCET/ROXICET) 5-325 MG per tablet Take 1-2 tablets by mouth every 4 (four) hours as needed (moderate to severe pain (when tolerating fluids)). Patient not taking: Reported on 02/15/2015 07/31/12   Geryl Rankins, MD  vitamin B-12 (CYANOCOBALAMIN) 1000 MCG tablet Take 1,000 mcg by mouth daily. Patient not taking: Reported on 04/20/2020    [provider]     Vitals:   05/18/20 0600 05/18/20 0622 05/18/20 0700 05/18/20 0755  BP: (!) 183/71 (!) 158/69 (!) 180/67   Pulse: 77 75 83   Resp: (!) 21 18 17    Temp:    97.9 F (36.6 C)  TempSrc:    Oral  SpO2: (!) 85% 91% (!) 81%   Weight:      Height:       Exam Gen obese adult WF on vent, sedated No rash, cyanosis or gangrene Sclera anicteric  JVP 10-12cm, L IJ 3-lumen Chest clear bilat ant/ lat no rales/ wheezing RRR no MRG Abd soft ntnd no mass or ascites +bs obese GU foley in place draining good amts of clear yellow urine MS no joint effusions or deformity Ext 1+ LUE edema below BP cuff, no other edema Neuro is sedated on the vent    Home meds:  - zocor 40 hs/ pepcid 20 bid/ zyrtec prn  - macrodantin  - effexor xr 75 qd  - ibuprofen prn/ percocet prn  - prn's/ vitamins/ supplements    UNa 05/15/20  < 10   CXR 8/11 - IMPRESSION: 1. Stable to slightly worse diffuse interstitial and airspace disease compatible with COVID-19 pneumonia.   ECHO 05/12/20 - LVEF  50-55%, G1DD, mild LVH    Na 144  K 5.3  BUN 144  Cr  4.47    ABG  7.24/ 50   I/O 7/25- 8/11 = 31 L in and 30 L out, net + 1 L since admit   No pressors since admission 7/31      Remdesivir 7/25- 7/30     IV vanc - 7/31 - 8/9, dc'd      IV maxipime - 7/31 x 3d , resumed 8/8         IV lasix  8/5-  8/8     Vent  PEEP 10, FiO2 80%   Assessment/ Plan: 1. AKI/ renal failure - nonoliguric AKI likely due to ATN from combination COVID infection, possible vanc effect + IV lasix/ prerenal possibility.  BP's have been wnl. Get renal US, UA and urine lytes. Check vanc leve (stopped 8/9). No indication for RRT at this point yet, hopefully can recover function soon.  Euvolemic on exam, agree w/ IVF"s at 75 cc/hr for now.  Check CVP if possible. Will follow.  2. COVID PNA - w/ severe ARDS, on vent 3. 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 Anemia/ thrombocytopenia - plt 101k, Hb 8.        Vinson Moselle  MD 05/18/2020, 9:03 AM  Recent Labs  Lab 05/17/20 0452 05/18/20 0237  WBC 9.6 6.9  HGB 8.8* 8.0*   Recent Labs  Lab 05/17/20 1546 05/17/20 1546 05/17/20 2046 05/18/20 0237  K 5.6*   < > 5.1 5.3*  BUN 175*  --   --  144*  CREATININE 4.39*  --   --  4.47*  CALCIUM 7.7*  --   --  7.4*   < > = values in this interval not displayed.

## 2020-05-18 NOTE — Progress Notes (Signed)
Peripherally Inserted Central Catheter Placement  The IV Nurse has discussed with the patient and/or persons authorized to consent for the patient, the purpose of this procedure and the potential benefits and risks involved with this procedure.  The benefits include less needle sticks, lab draws from the catheter, and the patient may be discharged home with the catheter. Risks include, but not limited to, infection, bleeding, blood clot (thrombus formation), and puncture of an artery; nerve damage and irregular heartbeat and possibility to perform a PICC exchange if needed/ordered by physician.  Alternatives to this procedure were also discussed.  Bard Power PICC patient education guide, fact sheet on infection prevention and patient information card has been provided to patient /or left at bedside.    PICC Placement Documentation  PICC Triple Lumen 05/18/20 PICC Right Basilic 38 cm 0 cm (Active)  Indication for Insertion or Continuance of Line Vasoactive infusions 05/18/20 2207  Exposed Catheter (cm) 0 cm 05/18/20 2207  Site Assessment Clean;Dry;Intact 05/18/20 2207  Lumen #1 Status Blood return noted;Flushed;Saline locked 05/18/20 2207  Lumen #2 Status Blood return noted;Flushed;Saline locked 05/18/20 2207  Lumen #3 Status Blood return noted;Flushed;Saline locked 05/18/20 2207  Dressing Type Transparent;Occlusive;Securing device 05/18/20 2207  Dressing Status Clean;Dry;Intact;Antimicrobial disc in place 05/18/20 2207  Safety Lock Not Applicable 05/18/20 2207  Line Adjustment (NICU/IV Team Only) No 05/18/20 2207  Dressing Intervention New dressing 05/18/20 2207  Dressing Change Due 05/25/20 05/18/20 2207       Christeen Douglas 05/18/2020, 10:36 PM

## 2020-05-18 NOTE — Progress Notes (Signed)
NUTRITION NOTE  Full assessment completed by this RD yesterday.   New consult for TF initiation and management.   Patient has OGT (gastric). Able to talk with RN again today. No attempt able to be made yesterday to place small bore post-pyloric tube. RN reports that OGT is currently clamped after med administration, but that patient has 700 ml output of dark green fluid from OGT overnight.   Plan is to start paralytic and prone within the next few hours.  Able to talk with Dr. Wynona Neat. Plan to not start TF at this time, possible addition of prokinetic to aid in motility, possible plan for CCM to attempt post-pyloric tube placement, do not wish to start TPN at this time.   RD will continue to follow. Recommend TPN initiation. Question if consideration for J-tube could be made if patient to remain Full Code.     Trenton Gammon, MS, RD, LDN, CNSC Inpatient Clinical Dietitian RD pager # available in AMION  After hours/weekend pager # available in [facility]

## 2020-05-18 NOTE — Progress Notes (Signed)
Patient was proned at about 3:30 PM Saturations noted to be persistently low following proning PEEP was increased from 10-12 and then to 14 with no change Auscultation revealed decreased bilateral air movement  Decision was made to unprone the patient  Saturations still remained low with duskiness to her skin.  Chest tube was placed to suction without an active air leak noted, chest tube was flushed with saline, tube noted to be kinked in fold of skin, leak noted following unkinking of the tube  Chest tube left to suction  Saturations started to improve  PEEP decreased back to 10 Weaning down oxygen supplementation  Chest x-ray reveals right-sided pneumothorax  Will leave chest tube to suction at present  Repeat chest x-ray in 4 hours

## 2020-05-18 NOTE — Progress Notes (Signed)
Patient returned to the supine position per O2 sats of 60-70%. Patient cyanotic during this time. Patient currently over 90% and cyanosis improved. MD at bedside.

## 2020-05-19 ENCOUNTER — Inpatient Hospital Stay (HOSPITAL_COMMUNITY): Payer: BC Managed Care – PPO

## 2020-05-19 LAB — COMPREHENSIVE METABOLIC PANEL
ALT: 82 U/L — ABNORMAL HIGH (ref 0–44)
AST: 55 U/L — ABNORMAL HIGH (ref 15–41)
Albumin: 2.5 g/dL — ABNORMAL LOW (ref 3.5–5.0)
Alkaline Phosphatase: 52 U/L (ref 38–126)
Anion gap: 14 (ref 5–15)
BUN: 153 mg/dL — ABNORMAL HIGH (ref 6–20)
CO2: 25 mmol/L (ref 22–32)
Calcium: 6.8 mg/dL — ABNORMAL LOW (ref 8.9–10.3)
Chloride: 104 mmol/L (ref 98–111)
Creatinine, Ser: 4.06 mg/dL — ABNORMAL HIGH (ref 0.44–1.00)
GFR calc Af Amer: 13 mL/min — ABNORMAL LOW (ref >60)
GFR calc non Af Amer: 11 mL/min — ABNORMAL LOW (ref >60)
Glucose, Bld: 111 mg/dL — ABNORMAL HIGH (ref 70–99)
Potassium: 4.9 mmol/L (ref 3.5–5.1)
Sodium: 143 mmol/L (ref 135–145)
Total Bilirubin: 0.5 mg/dL (ref 0.3–1.2)
Total Protein: 5.5 g/dL — ABNORMAL LOW (ref 6.5–8.1)

## 2020-05-19 LAB — CBC WITH DIFFERENTIAL/PLATELET
Abs Immature Granulocytes: 0.15 10*3/uL — ABNORMAL HIGH (ref 0.00–0.07)
Basophils Absolute: 0 10*3/uL (ref 0.0–0.1)
Basophils Relative: 0 %
Eosinophils Absolute: 0 10*3/uL (ref 0.0–0.5)
Eosinophils Relative: 1 %
HCT: 26.2 % — ABNORMAL LOW (ref 36.0–46.0)
Hemoglobin: 8.4 g/dL — ABNORMAL LOW (ref 12.0–15.0)
Immature Granulocytes: 2 %
Lymphocytes Relative: 6 %
Lymphs Abs: 0.4 10*3/uL — ABNORMAL LOW (ref 0.7–4.0)
MCH: 30.2 pg (ref 26.0–34.0)
MCHC: 32.1 g/dL (ref 30.0–36.0)
MCV: 94.2 fL (ref 80.0–100.0)
Monocytes Absolute: 0.3 10*3/uL (ref 0.1–1.0)
Monocytes Relative: 5 %
Neutro Abs: 5.6 10*3/uL (ref 1.7–7.7)
Neutrophils Relative %: 86 %
Platelets: 93 10*3/uL — ABNORMAL LOW (ref 150–400)
RBC: 2.78 MIL/uL — ABNORMAL LOW (ref 3.87–5.11)
RDW: 15 % (ref 11.5–15.5)
WBC: 6.4 10*3/uL (ref 4.0–10.5)
nRBC: 0 % (ref 0.0–0.2)

## 2020-05-19 LAB — GLUCOSE, CAPILLARY
Glucose-Capillary: 108 mg/dL — ABNORMAL HIGH (ref 70–99)
Glucose-Capillary: 122 mg/dL — ABNORMAL HIGH (ref 70–99)
Glucose-Capillary: 125 mg/dL — ABNORMAL HIGH (ref 70–99)
Glucose-Capillary: 135 mg/dL — ABNORMAL HIGH (ref 70–99)
Glucose-Capillary: 80 mg/dL (ref 70–99)
Glucose-Capillary: 85 mg/dL (ref 70–99)
Glucose-Capillary: 94 mg/dL (ref 70–99)
Glucose-Capillary: 96 mg/dL (ref 70–99)

## 2020-05-19 LAB — MAGNESIUM
Magnesium: 3.4 mg/dL — ABNORMAL HIGH (ref 1.7–2.4)
Magnesium: 3 mg/dL — ABNORMAL HIGH (ref 1.7–2.4)

## 2020-05-19 LAB — PHOSPHORUS
Phosphorus: 10.1 mg/dL — ABNORMAL HIGH (ref 2.5–4.6)
Phosphorus: 10.2 mg/dL — ABNORMAL HIGH (ref 2.5–4.6)

## 2020-05-19 MED ORDER — SODIUM CHLORIDE 0.9% FLUSH
10.0000 mL | Freq: Three times a day (TID) | INTRAVENOUS | Status: DC
  Administered 2020-05-19 – 2020-05-21 (×4): 10 mL

## 2020-05-19 MED ORDER — NEPRO/CARBSTEADY PO LIQD
1000.0000 mL | ORAL | Status: DC
  Administered 2020-05-19 – 2020-05-20 (×2): 1000 mL
  Filled 2020-05-19 (×6): qty 1000

## 2020-05-19 MED ORDER — PROSOURCE TF PO LIQD
45.0000 mL | Freq: Every day | ORAL | Status: DC
  Administered 2020-05-19 – 2020-05-21 (×9): 45 mL
  Filled 2020-05-19 (×10): qty 45

## 2020-05-19 NOTE — Progress Notes (Signed)
NAME:  Donna Campbell, MRN:  [id number], DOB:  [birth year], LOS: 18 ADMISSION DATE:  05/14/2020, CONSULTATION DATE: 2020-05-14 REFERRING MD: EDP, CHIEF COMPLAINT: Dyspnea  Brief History   59 year old female presented to the [facility] emergency room with dyspnea on 2020-05-14.  She had been diagnosed with COVID on April 26, 2020 at an urgent care.  She was intubated in the evening of July 25.  Past Medical History  GERD Asthma  Significant Hospital Events   July 25 admission/intubation, prone, paralyzed 7/26 NSTEMI July 28-30 recurrent vomiting while prone 8/2 prone held 8/5 Bradycardia events overnight, ventilator changes made, versed drip held> cough, vomit, aspirated 8/7 refractory hypoxia with saturations in low 80s 8/8 large right-sided pneumothorax-chest tube placed with resolution 8/11 small apical pneumothorax is stable 8/11 Desat when proned with worsening Ptx so retuned back to supine  Consults:   8/11 consulted renal  Procedures:  ETT 7/25 >  Left IJ CVL 7/25 >  8/8 chest tube placement  Significant Diagnostic Tests:  7/27 Echo > LVEF 45-50%, grade 1 diastolic dystolic, RV size/function normal, valves OK 8/5 Echo> LVEF WNL, RV normal size/function, valves OK  Micro Data:  July 20 SARS-CoV-2 positive 7/25 blood > negative 7/31 resp > MRSA 8/6 resp culture > MRSA  Antimicrobials/COVID treatment  July 25 remdesivir>  July 25 Tocilizumab> July 26 July 25 Solu-Medrol 60 mg every 12 hours>  7/31 cefepime >> 8/2-8/8 7/31 vanc >> 8/6-8/10  Interim history/subjective:   Desatted yesterday when prone.  Pneumothorax less larger hence placed back to supine position.  Chest tube flushed and unkinked with improvement in pneumothorax  Objective   Blood pressure (!) 130/57, pulse 75, temperature 97.7 F (36.5 C), temperature source Axillary, resp. rate (!) 32, height 5\' 4"  (1.626 m), weight 104.6 kg, last menstrual period 07/17/2012, SpO2 97 %.    Vent Mode:  PRVC FiO2 (%):  [80 %] 80 % Set Rate:  [32 bmp] 32 bmp Vt Set:  [380 mL] 380 mL PEEP:  [10 cmH20] 10 cmH20 Plateau Pressure:  [15 cmH20-25 cmH20] 25 cmH20   Intake/Output Summary (Last 24 hours) at 05/19/2020 0927 Last data filed at 05/19/2020 0630 Gross per 24 hour  Intake 2305.25 ml  Output 2715 ml  Net -409.75 ml   Filed Weights   05/16/20 0455 05/17/20 0429 05/18/20 0500  Weight: 100.8 kg 100.8 kg 104.6 kg    Examination: Blood pressure (!) 130/57, pulse 75, temperature 97.7 F (36.5 C), temperature source Axillary, resp. rate (!) 32, height 5\' 4"  (1.626 m), weight 104.6 kg, last menstrual period 07/17/2012, SpO2 97 %. Gen:      No acute distress HEENT:  EOMI, sclera anicteric Neck:     No masses; no thyromegaly, ETT Lungs:    Clear to auscultation bilaterally; normal respiratory effort CV:         Regular rate and rhythm; no murmurs Abd:      + bowel sounds; soft, non-tender; no palpable masses, no distension Ext:    No edema; adequate peripheral perfusion Skin:      Warm and dry; no rash Neuro: Sedated, unresponsive  Labs/imaging reviewed Significant for BUN/creatinine 153/4.06 WBC 6.4, hemoglobin 8.4, platelets 93 Chest x-ray 8/12 -small apical pneumothorax  Resolved Hospital Problem list     Assessment & Plan:   Severe ARDS due to COVID 19 pneumonia > worsening 8/6 after aspiration event MRSA pneumonia-persistent-completed antibiotics Continue low tidal volume ventilation with permissive hypercapnia Continue Solu-Medrol Will not  prone further due to instability with desats and enlarging pneumothorax yesterday Intermittent paralytics  Recurrent aspiration events (both supine and prone) -holding enteral feeds currently; has high residuals every time they were checked after free water or oral meds were given and the tube is clamped.  R Arm cool to touch, cap refill intact, pulses noted by doppler and weakly palpable -Continue to monitor  Pneumothorax -Chest  tube to suction. -Continue to monitor  Bradycardia> 12 lead with RBBB and possible RV strain pattern, PE? 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  Vent dyssynchrony, severe > worsened significantly on 8/5 when versed drip held> cough, vomit, aspirated Need for sedation for mechanical ventilation  -NMB, goal RASS -4 to -5 -Continue Dilaudid infusion, versed infusion   Hyperglycemia-controlled -Continue detemir and SSI -Goal BG 140-180  AKI > recurrent.  With undetectable sodium this is likely prerenal. Hypernatremia > improved 8/5 Hyperkalemia; mildly peaked lateral T waves. -Monitor BMET and UOP -Replace electrolytes as needed -Nephrology on board  Thrombocytopenia/anemia: -Continue to monitor -Transfuse PRBC for Hgb < 7 gm/dL  Goals of care: full code Anything else  Best practice:  Diet: tube feeding per protocol Pain/Anxiety/Delirium protocol (if indicated): as above VAP protocol (if indicated): yes DVT prophylaxis: lovenox GI prophylaxis: Pantoprazole for stress ulcer prophylaxis Glucose control: as above Mobility: bed rest Code Status: full Family Communication: Pending Disposition: remain in ICU  Critical Care:   The patient is critically ill with multiple organ system failure and requires high complexity decision making for assessment and support, frequent evaluation and titration of therapies, advanced monitoring, review of radiographic studies and interpretation of complex data.   Critical Care Time devoted to patient care services, exclusive of separately billable procedures, described in this note is 45 minutes.   Chilton Greathouse MD [facility] Please see [email or web address] for pager details.  05/19/2020, 9:28 AM

## 2020-05-19 NOTE — Progress Notes (Signed)
Nutrition Follow-up  DOCUMENTATION CODES:   Obesity unspecified  INTERVENTION:  - will adjust TF regimen: Nepro @ 20 ml/hr advance by 10 ml every 8 hours to reach goal rate of 50 ml/hr with 45 ml Prosource TF x5/day. - at goal rate, this regimen will provide 2360 kcal (98% estimated kcal need), 152 grams protein, and 872 ml free water.  - free water flush to continue to be per CCM.   NUTRITION DIAGNOSIS:   Increased nutrient needs related to acute illness, catabolic illness (COVID-19 infection) as evidenced by estimated needs -ongoing  GOAL:   Provide needs based on ASPEN/SCCM guidelines -unmet at this time  MONITOR:   Vent status, TF tolerance, Labs, Weight trends, Skin  REASON FOR ASSESSMENT:   Consult Enteral/tube feeding initiation and management  ASSESSMENT:   Pt admitted with severe ARDS, COVID-19 pneumonia. PMH includes asthma, GERD.  Significant Events: 7/25- admission; intubation and OGT placement; initial RD assessment; TF initiation 8/1- aspiration event after vomiting TF while proned 8/2- patient assessed at bedside by RD--NFPE completed 8/5- aspiration event after large volume vomiting TF, copious amount of TF suctioned from mouth and ETT 8/6- attempts by Charge RN at bedside and Diagnostic Radiology using fluoro and C-arm unsuccessful in placing post-pyloric tube 8/8- large R-sided pneumothorax; chest tube placement 8/9- flipped to prone position  8/10- flipped to supine 8/11- flipped to prone position at 1530 but then returned to supine at 1618; small bore NGT placed at bedside   Patient remains intubated with OGT and small bore NGT now in place. Imaging from small bore tube was read as distal stomach (looking at image, appears to be approaching the pylorus).   Patient started on TF per protocol yesterday at ~1600: Vital High Protein @ 40 ml/hr with 45 ml Prosource TF BID and 100 ml free water every 4 hours. This regimen provides 1040 kcal, 106 grams  protein, and 1402 ml free water.   Weight now consistent with weight 8/4-8/7. Flow sheet documentation indicates mild pitting edema to BUE and moderate pitting edema to BLE.  Nephrology following. No plan for CRRT as of yesterday AM. Will monitor Nephrology recommendations.     Patient is currently intubated on ventilator support MV: 11.8 L/min Temp (24hrs), Avg:97.2 F (36.2 C), Min:97 F (36.1 C), Max:97.5 F (36.4 C) Propofol: none  Labs reviewed; CBG: 80 mg/dl, BUN: 676 mg/dl, creatinine: 1.95 mg/dl, Ca: 6.8 mg/dl, Phos: 09.3 mg/dl, Mg: 3.4 mg/dl, LFTs elevated, GFR: 11 ml/min. Medications reviewed; 100 mg colace BID, sliding scale novolog, 25 units levemir BID, 60 mg solu-medrol BID, 5 mg reglan QID, 15 ml multivitamin/day, 17 g miralax/day, 5 ml senokot BID IVF; 150 mEq sodium bicarb-sterile water @ 75 ml/hr. Drips; versed @ 8 mg/hr, nimbex @ 2 mcg/kg/min.    Diet Order:   Diet Order            Diet NPO time specified  Diet effective now                 EDUCATION NEEDS:   No education needs have been identified at this time  Skin:  Skin Assessment: Skin Integrity Issues: Skin Integrity Issues:: DTI DTI: R forehead (newly documented 8/8; RN reports due to leads on forehead while proning)  Last BM:  8/5  Height:   Ht Readings from Last 1 Encounters:  05/04/20 5\' 4"  (1.626 m)    Weight:   Wt Readings from Last 1 Encounters:  05/18/20 104.6 kg    Estimated Nutritional  Needs:  Kcal:  2400 kcal Protein:  136-152 grams Fluid:  >/= 2.5 L/day    Trenton Gammon, MS, RD, LDN, CNSC Inpatient Clinical Dietitian RD pager # available in AMION  After hours/weekend pager # available in [facility]

## 2020-05-19 NOTE — Progress Notes (Signed)
eLink Physician-Brief Progress Note Patient Name: Donna Campbell DOB: [birth year] MRN: [id number]   Date of Service  05/19/2020  HPI/Events of Note  Nursing request for AM labs.   eICU Interventions  Will order: 1. CBC with platelets and CMP in AM.      Intervention Category Major Interventions: Other:  Lenell Antu 05/19/2020, 3:16 AM

## 2020-05-19 NOTE — Progress Notes (Signed)
[facility] Progress Note  Subjective:  Patient not examined today directly given COVID-19 + status, utilizing data taken from chart +/- discussions w/ providers and staff.   UOP good, creat down slihgtly today to 4.0 from 4.4, BUN stable 153.    Vitals:   05/19/20 0800 05/19/20 0900 05/19/20 1000 05/19/20 1100  BP: (!) 130/57 (!) 137/56 (!) 143/53 (!) 166/60  Pulse: 75 73 76 72  Resp: (!) 32 (!) 32 (!) 32 (!) 32  Temp:      TempSrc:      SpO2: 97% 97% 97% 95%  Weight:      Height:        Exam:  Patient not examined today directly given COVID-19 + status, utilizing data taken from chart +/- discussions w/ providers and staff.      Home meds:  - zocor 40 hs/ pepcid 20 bid/ zyrtec prn  - macrodantin  - effexor xr 75 qd  - ibuprofen prn/ percocet prn  - prn's/ vitamins/ supplements    UNa 05/15/20  < 10   UA 8/11 - many bact, +yeast, >50 rbc and wbc/ hpf     UNa 11, UCr 63   Renal US - 13.3/ 11.2 cm kidneys, no hydro, normal echo   CXR 8/11 - IMPRESSION: 1. Stable to slightly worse diffuse interstitial and airspace disease compatible with COVID-19 pneumonia.   ECHO 05/12/20 - LVEF 50-55%, G1DD, mild LVH   No pressors since admission 7/31      Remdesivir 7/25- 7/30     IV vanc - 7/31 - 8/9, dc'd      IV maxipime - 7/31 x 3d , resumed 8/8         IV lasix 8/5-  8/8     Vent  PEEP 10, FiO2 80%    Vanc level on 8/11 = 37 (last dose 1.5gm on 8/9)  Assessment/ Plan: 1. AKI/ renal failure - random vanc yest was 37 w/ last dose on 8/9. Suspect mild-mod ATN from vanc toxicity +/- prerenal +/- COVID 19 effects on kidney. Peak creat yest at 4.4, down to 4.0 today, good UOP. Cont IVF's, no indication for RRT at this time.  Cont IVF's at 75/hr.   2. COVID PNA - w/ severe ARDS, on vent 3. 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 Anemia/ thrombocytopenia - plt 101k, Hb 8.         Donna Campbell 05/19/2020, 11:19 AM   Recent Labs  Lab  05/18/20 0237 05/18/20 1734 05/19/20 0419  K 5.3*  --  4.9  BUN 144*  --  153*  CREATININE 4.47*  --  4.06*  CALCIUM 7.4*  --  6.8*  PHOS  --  9.5* 10.1*  HGB 8.0*  --  8.4*   Inpatient medications: . artificial tears  1 application Both Eyes Y3K  . chlorhexidine gluconate (MEDLINE KIT)  15 mL Mouth Rinse BID  . Chlorhexidine Gluconate Cloth  6 each Topical Daily  . docusate  100 mg Per Tube BID  . enoxaparin (LOVENOX) injection  30 mg Subcutaneous Daily  . feeding supplement (PROSource TF)  45 mL Per Tube 5 X Daily  . free water  100 mL Per Tube Q4H  . insulin aspart  0-20 Units Subcutaneous Q4H  . insulin detemir  25 Units Subcutaneous BID  . mouth rinse  15 mL Mouth Rinse 10 times per day  . methylPREDNISolone (SOLU-MEDROL) injection  60 mg Intravenous  BID  . metoCLOPramide (REGLAN) injection  5 mg Intravenous Q6H  . multivitamin  15 mL Per Tube QHS  . pantoprazole (PROTONIX) IV  40 mg Intravenous QHS  . polyethylene glycol  17 g Per Tube Daily  . sennosides  5 mL Per Tube BID  . sodium chloride flush  10 mL Intracatheter Q8H  . sodium chloride flush  10-40 mL Intracatheter Q12H  . venlafaxine  37.5 mg Per Tube BID   . sodium chloride Stopped (05/17/20 2304)  . cisatracurium (NIMBEX) infusion 2 mcg/kg/min (05/19/20 0600)  . feeding supplement (NEPRO CARB STEADY) 1,000 mL (05/19/20 1006)  . fentaNYL infusion INTRAVENOUS Stopped (05/13/20 1331)  . HYDROmorphone 4 mg/hr (05/19/20 0650)  . midazolam 8 mg/hr (05/19/20 0649)  .  sodium bicarbonate (isotonic) infusion in sterile water 75 mL/hr at 05/19/20 0600   sodium chloride, fentaNYL, HYDROmorphone, labetalol, midazolam, sodium chloride flush

## 2020-05-20 ENCOUNTER — Inpatient Hospital Stay (HOSPITAL_COMMUNITY): Payer: BC Managed Care – PPO

## 2020-05-20 DIAGNOSIS — J939 Pneumothorax, unspecified: Secondary | ICD-10-CM

## 2020-05-20 DIAGNOSIS — Z452 Encounter for adjustment and management of vascular access device: Secondary | ICD-10-CM

## 2020-05-20 LAB — CBC
HCT: 26.3 % — ABNORMAL LOW (ref 36.0–46.0)
Hemoglobin: 8 g/dL — ABNORMAL LOW (ref 12.0–15.0)
MCH: 29.3 pg (ref 26.0–34.0)
MCHC: 30.4 g/dL (ref 30.0–36.0)
MCV: 96.3 fL (ref 80.0–100.0)
Platelets: 91 10*3/uL — ABNORMAL LOW (ref 150–400)
RBC: 2.73 MIL/uL — ABNORMAL LOW (ref 3.87–5.11)
RDW: 15.5 % (ref 11.5–15.5)
WBC: 7.6 10*3/uL (ref 4.0–10.5)
nRBC: 0.3 % — ABNORMAL HIGH (ref 0.0–0.2)

## 2020-05-20 LAB — RENAL FUNCTION PANEL
Albumin: 3 g/dL — ABNORMAL LOW (ref 3.5–5.0)
Anion gap: 16 — ABNORMAL HIGH (ref 5–15)
BUN: 155 mg/dL — ABNORMAL HIGH (ref 6–20)
CO2: 28 mmol/L (ref 22–32)
Calcium: 7.4 mg/dL — ABNORMAL LOW (ref 8.9–10.3)
Chloride: 100 mmol/L (ref 98–111)
Creatinine, Ser: 4.4 mg/dL — ABNORMAL HIGH (ref 0.44–1.00)
GFR calc Af Amer: 12 mL/min — ABNORMAL LOW (ref >60)
GFR calc non Af Amer: 10 mL/min — ABNORMAL LOW (ref >60)
Glucose, Bld: 247 mg/dL — ABNORMAL HIGH (ref 70–99)
Phosphorus: 11.8 mg/dL — ABNORMAL HIGH (ref 2.5–4.6)
Potassium: 5.6 mmol/L — ABNORMAL HIGH (ref 3.5–5.1)
Sodium: 144 mmol/L (ref 135–145)

## 2020-05-20 LAB — GLUCOSE, CAPILLARY
Glucose-Capillary: 171 mg/dL — ABNORMAL HIGH (ref 70–99)
Glucose-Capillary: 231 mg/dL — ABNORMAL HIGH (ref 70–99)
Glucose-Capillary: 208 mg/dL — ABNORMAL HIGH (ref 70–99)
Glucose-Capillary: 217 mg/dL — ABNORMAL HIGH (ref 70–99)
Glucose-Capillary: 222 mg/dL — ABNORMAL HIGH (ref 70–99)
Glucose-Capillary: 243 mg/dL — ABNORMAL HIGH (ref 70–99)

## 2020-05-20 LAB — BASIC METABOLIC PANEL
Anion gap: 15 (ref 5–15)
BUN: 171 mg/dL — ABNORMAL HIGH (ref 6–20)
CO2: 28 mmol/L (ref 22–32)
Calcium: 6.8 mg/dL — ABNORMAL LOW (ref 8.9–10.3)
Chloride: 101 mmol/L (ref 98–111)
Creatinine, Ser: 4.6 mg/dL — ABNORMAL HIGH (ref 0.44–1.00)
GFR calc Af Amer: 11 mL/min — ABNORMAL LOW (ref >60)
GFR calc non Af Amer: 10 mL/min — ABNORMAL LOW (ref >60)
Glucose, Bld: 186 mg/dL — ABNORMAL HIGH (ref 70–99)
Potassium: 5.5 mmol/L — ABNORMAL HIGH (ref 3.5–5.1)
Sodium: 144 mmol/L (ref 135–145)

## 2020-05-20 LAB — PHOSPHORUS: Phosphorus: 11.9 mg/dL — ABNORMAL HIGH (ref 2.5–4.6)

## 2020-05-20 LAB — MAGNESIUM: Magnesium: 3.5 mg/dL — ABNORMAL HIGH (ref 1.7–2.4)

## 2020-05-20 MED ORDER — LORAZEPAM 2 MG/ML IJ SOLN: 4.0000 mg | INTRAMUSCULAR | Status: DC

## 2020-05-20 MED ORDER — SODIUM CHLORIDE 0.9% FLUSH
10.0000 mL | Freq: Three times a day (TID) | INTRAVENOUS | Status: DC
  Administered 2020-05-20 – 2020-05-21 (×3): 10 mL

## 2020-05-20 MED ORDER — PRISMASOL BGK 4/2.5 32-4-2.5 MEQ/L REPLACEMENT SOLN: Status: DC

## 2020-05-20 MED ORDER — SEVELAMER CARBONATE 800 MG PO TABS
800.0000 mg | ORAL_TABLET | Freq: Three times a day (TID) | ORAL | Status: DC
  Administered 2020-05-20 (×3): 800 mg via ORAL
  Filled 2020-05-20 (×5): qty 1

## 2020-05-20 MED ORDER — ATROPINE SULFATE 1 MG/10ML IJ SOSY
PREFILLED_SYRINGE | INTRAMUSCULAR | Status: AC
  Administered 2020-05-20: 1 mg
  Filled 2020-05-20: qty 10

## 2020-05-20 MED ORDER — HEPARIN SODIUM (PORCINE) 1000 UNIT/ML DIALYSIS
1000.0000 [IU] | INTRAMUSCULAR | Status: DC | PRN
  Administered 2020-05-20: 2400 [IU] via INTRAVENOUS_CENTRAL
  Filled 2020-05-20 (×2): qty 6

## 2020-05-20 MED ORDER — SODIUM CHLORIDE 0.9 % FOR CRRT: INTRAVENOUS_CENTRAL | Status: DC | PRN

## 2020-05-20 MED ORDER — PRISMASOL BGK 4/2.5 32-4-2.5 MEQ/L IV SOLN: INTRAVENOUS | Status: DC

## 2020-05-20 MED ORDER — HYDRALAZINE HCL 20 MG/ML IJ SOLN
10.0000 mg | INTRAMUSCULAR | Status: DC | PRN
  Administered 2020-05-20: 20 mg via INTRAVENOUS
  Filled 2020-05-20: qty 1

## 2020-05-20 MED ORDER — ALTEPLASE 2 MG IJ SOLR: 2.0000 mg | Freq: Once | INTRAMUSCULAR | Status: DC | PRN

## 2020-05-20 NOTE — Progress Notes (Signed)
Patient heart rate dropped during removal of central line and right lateral chest tube to mid 40s non sustained. Atropine at bedside but not administered

## 2020-05-20 NOTE — Progress Notes (Signed)
[facility] Progress Note  Subjective:  Pt seen in room, off COVID precautions now.  I/O even yest, UOP down 375 cc / 24 hrs, creat up 4.0 > 4.6, BUN up 171. K 5.5.  BP's good, no pressors.  FiO2 100% now.   Vitals:   05/20/20 0400 05/20/20 0500 05/20/20 0600 05/20/20 0745  BP: (!) 150/72 (!) 151/75 (!) 159/74 (!) 151/73  Pulse: 93 (!) 104 (!) 109 (!) 119  Resp: (!) 32 (!) 32 (!) 32 (!) 32  Temp: 98.4 F (36.9 C)     TempSrc: Axillary     SpO2: 94% 93% 95% (!) 88%  Weight:      Height:        Exam:  Gen obese adult WF on vent, sedated No rash, cyanosis or gangrene Sclera anicteric  JVP 10-12cm, L IJ 3-lumen Chest clear bilat ant/ lat no rales/ wheezing RRR no MRG Abd soft ntnd no mass or ascites +bs obese GU foley in place draining good amts of clear yellow urine MS no joint effusions or deformity Ext mild- mod edema of extremities Neuro is sedated on the vent     Home meds:  - zocor 40 hs/ pepcid 20 bid/ zyrtec prn  - macrodantin  - effexor xr 75 qd  - ibuprofen prn/ percocet prn  - prn's/ vitamins/ supplements    UNa 05/15/20  < 10   UA 8/11 - many bact, +yeast, >50 rbc and wbc/ hpf     UNa 11, UCr 63   Renal US - 13.3/ 11.2 cm kidneys, no hydro, normal echo   CXR 8/11 - IMPRESSION: 1. Stable to slightly worse diffuse interstitial and airspace disease compatible with COVID-19 pneumonia.   ECHO 05/12/20 - LVEF 50-55%, G1DD, mild LVH   No pressors since admission 7/31      Remdesivir 7/25- 7/30     IV vanc - 7/31 - 8/9, dc'd      IV maxipime - 7/31 x 3d , resumed 8/8         IV lasix 8/5-  8/8     Vent  PEEP 10, FiO2 80%    Vanc level on 8/11 = 37 (last dose 1.5gm on 8/9)  Assessment/ Plan: 1. AKI/ renal failure - random vanc yest was 37 w/ last dose on 8/9. Suspect mild-mod ATN from vanc toxicity +/- prerenal +/- COVID 19 effects on kidney. UOP down and B/Cr back up again today, 171/4.60. May need RRT soon, will d/w CCM.  Will dc IVF"s.   2. COVID PNA - w/ severe ARDS, on vent, on FiO2 100% now.  3. 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 Anemia/ thrombocytopenia - plt91k, Hb 8.         Rob Misk Galentine 05/20/2020, 8:05 AM   Recent Labs  Lab 05/19/20 0419 05/19/20 0419 05/19/20 1545 05/20/20 0400  K 4.9  --   --  5.5*  BUN 153*  --   --  171*  CREATININE 4.06*  --   --  4.60*  CALCIUM 6.8*  --   --  6.8*  PHOS 10.1*   < > 10.2* 11.9*  HGB 8.4*  --   --  8.0*   < > = values in this interval not displayed.   Inpatient medications: . artificial tears  1 application Both Eyes W1U  . chlorhexidine gluconate (MEDLINE KIT)  15 mL Mouth Rinse BID  . Chlorhexidine Gluconate Cloth  6 each  Topical Daily  . docusate  100 mg Per Tube BID  . enoxaparin (LOVENOX) injection  30 mg Subcutaneous Daily  . feeding supplement (PROSource TF)  45 mL Per Tube 5 X Daily  . free water  100 mL Per Tube Q4H  . insulin aspart  0-20 Units Subcutaneous Q4H  . insulin detemir  25 Units Subcutaneous BID  . mouth rinse  15 mL Mouth Rinse 10 times per day  . methylPREDNISolone (SOLU-MEDROL) injection  60 mg Intravenous BID  . metoCLOPramide (REGLAN) injection  5 mg Intravenous Q6H  . multivitamin  15 mL Per Tube QHS  . pantoprazole (PROTONIX) IV  40 mg Intravenous QHS  . polyethylene glycol  17 g Per Tube Daily  . sennosides  5 mL Per Tube BID  . sevelamer carbonate  800 mg Oral TID WC  . sodium chloride flush  10 mL Intracatheter Q8H  . sodium chloride flush  10-40 mL Intracatheter Q12H  . venlafaxine  37.5 mg Per Tube BID   . sodium chloride Stopped (05/17/20 2304)  . cisatracurium (NIMBEX) infusion 2.5 mcg/kg/min (05/20/20 8347)  . feeding supplement (NEPRO CARB STEADY) 40 mL/hr at 05/20/20 0613  . fentaNYL infusion INTRAVENOUS Stopped (05/13/20 1331)  . HYDROmorphone 4 mg/hr (05/20/20 5830)  . midazolam 10 mg/hr (05/20/20 7460)   sodium chloride, fentaNYL, HYDROmorphone, labetalol, midazolam, sodium  chloride flush

## 2020-05-20 NOTE — Progress Notes (Signed)
NAME:  Donna Campbell, MRN:  [id number], DOB:  1961/09/08, LOS: 19 ADMISSION DATE:  05/07/2020, CONSULTATION DATE: May 01, 2020 REFERRING MD: EDP, CHIEF COMPLAINT: Dyspnea  Brief History   59 year old female presented to the [facility] emergency room with dyspnea on May 01, 2020.  She had been diagnosed with COVID on April 26, 2020 at an urgent care.  She was intubated in the evening of July 25.  Past Medical History  GERD Asthma  Significant Hospital Events   July 25 admission/intubation, prone, paralyzed 7/26 NSTEMI July 28-30 recurrent vomiting while prone 8/2 prone held 8/5 Bradycardia events overnight, ventilator changes made, versed drip held> cough, vomit, aspirated 8/7 refractory hypoxia with saturations in low 80s 8/8 large right-sided pneumothorax-chest tube placed with resolution 8/11 small apical pneumothorax is stable 8/11 Desat when proned with worsening Ptx so retuned back to supine 8/13 Starting CVVH, New chest tube placed for worsening ptx  Consults:   8/11 consulted renal  Procedures:  ETT 7/25 >  Left IJ CVL 7/25 >  8/8 chest tube placement  Significant Diagnostic Tests:  7/27 Echo > LVEF 45-50%, grade 1 diastolic dystolic, RV size/function normal, valves OK 8/5 Echo> LVEF WNL, RV normal size/function, valves OK  Micro Data:  July 20 SARS-CoV-2 positive 7/25 blood > negative 7/31 resp > MRSA 8/6 resp culture > MRSA  Antimicrobials/COVID treatment  July 25 remdesivir>  July 25 Tocilizumab> July 26 July 25 Solu-Medrol 60 mg every 12 hours>  7/31 cefepime >> 8/2-8/8 7/31 vanc >> 8/6-8/10  Interim history/subjective:   Worsening renal failure. Chest tube was on the right and new chest tube had to be placed anteriorly.  Objective   Blood pressure 132/73, pulse (!) 112, temperature 98.4 F (36.9 C), temperature source Axillary, resp. rate (!) 32, height 5\' 4"  (1.626 m), weight 104.6 kg, last menstrual period 07/17/2012, SpO2 (!) 84 %.    Vent  Mode: PRVC FiO2 (%):  [80 %-100 %] 100 % Set Rate:  [32 bmp] 32 bmp Vt Set:  [380 mL] 380 mL PEEP:  [10 cmH20] 10 cmH20 Plateau Pressure:  [23 cmH20-28 cmH20] 27 cmH20   Intake/Output Summary (Last 24 hours) at 05/20/2020 1349 Last data filed at 05/20/2020 05/22/2020 Gross per 24 hour  Intake 2827.05 ml  Output 1310 ml  Net 1517.05 ml   Filed Weights   05/16/20 0455 05/17/20 0429 05/18/20 0500  Weight: 100.8 kg 100.8 kg 104.6 kg    Examination: Blood pressure 132/73, pulse (!) 112, temperature 98.4 F (36.9 C), temperature source Axillary, resp. rate (!) 32, height 5\' 4"  (1.626 m), weight 104.6 kg, last menstrual period 07/17/2012, SpO2 (!) 84 %. Gen:      No acute distress HEENT:  EOMI, sclera anicteric Neck:     No masses; no thyromegaly, ET tube Lungs:    Clear to auscultation bilaterally; normal respiratory effort CV:         Regular rate and rhythm; no murmurs Abd:      + bowel sounds; soft, non-tender; no palpable masses, no distension Ext:    No edema; adequate peripheral perfusion Skin:      Warm and dry; no rash Neuro: Sedated, unresponsive  Labs/imaging reviewed Significant for potassium 5.5, BUN/creatinine 131/4.6, WBC 7.6, hemoglobin 8. Chest x-ray 8/13 -resolution of right pneumothorax with new chest tube placement.  Bilateral airspace disease.  Resolved Hospital Problem list     Assessment & Plan:   Severe ARDS due to COVID 19 pneumonia > worsening 8/6  after aspiration event MRSA pneumonia-persistent-completed antibiotics Continue low tidal volume ventilation with permissive hypercapnia Continue Solu-Medrol Will not prone further due to instability with desats and enlarging pneumothorax Intermittent paralytics  Recurrent aspiration events (both supine and prone)  tolerating tube feeds so far.  We will continue to monitor closely  Pneumothorax.  Required additional chest tube placement today -Chest tube to suction. -Continue to monitor  Bradycardia> 12 lead  with RBBB and possible RV strain pattern, PE? 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  Vent dyssynchrony, severe > worsened significantly on 8/5 when versed drip held> cough, vomit, aspirated Need for sedation for mechanical ventilation  -NMB, goal RASS -4 to -5 -Continue Dilaudid infusion, versed infusion   Hyperglycemia-controlled -Continue detemir and SSI -Goal BG 140-180  AKI > recurrent.  With undetectable sodium this is likely prerenal. Hypernatremia > improved 8/5 Hyperkalemia; mildly peaked lateral T waves. -Monitor BMET and UOP -Replace electrolytes as needed -Nephrology on board  Thrombocytopenia/anemia: -Continue to monitor -Transfuse PRBC for Hgb < 7 gm/dL  Goals of care: full code Discussed with husband and updated daily.  He is aware of how sick she is. Prognosis is guarded  Best practice:  Diet: tube feeding per protocol Pain/Anxiety/Delirium protocol (if indicated): as above VAP protocol (if indicated): yes DVT prophylaxis: lovenox GI prophylaxis: Pantoprazole for stress ulcer prophylaxis Glucose control: as above Mobility: bed rest Code Status: full  Family Communication: Husband updated 8/13 Disposition: remain in ICU  Critical Care:   The patient is critically ill with multiple organ system failure and requires high complexity decision making for assessment and support, frequent evaluation and titration of therapies, advanced monitoring, review of radiographic studies and interpretation of complex data.   Critical Care Time devoted to patient care services, exclusive of separately billable procedures, described in this note is 45 minutes.   Chilton Greathouse MD [place] [facility] Please see [email or web address] for pager details.  05/20/2020, 1:49 PM

## 2020-05-20 NOTE — Progress Notes (Signed)
eLink Physician-Brief Progress Note Patient Name: Donna Campbell DOB: [birth year] MRN: [id number]   Date of Service  05/20/2020  HPI/Events of Note  Patient with transient bradycardia treated with Atropine, she's now in Afib with RVR rate in the 110's.  eICU Interventions  Will monitor rhythm for now without intervention given proclivity for severe bradycardia.        Migdalia Dk 05/20/2020, 11:44 PM

## 2020-05-20 NOTE — Progress Notes (Signed)
Abnormal electrolyteseLink Physician-Brief Progress Note Patient Name: Donna Campbell DOB: Jan 19, 1961 MRN: [id number]   Date of Service  05/20/2020  HPI/Events of Note  Abnormal electrolytes.  eICU Interventions  Patient will likely need CRRT. Will order Renvela for markedly elevated phosphate.        Thomasene Lot Arby Dahir 05/20/2020, 5:31 AM

## 2020-05-20 NOTE — Progress Notes (Signed)
During HD catheter placement patient desat to 30s. CCM aware and stat chest x-ray obtained. Patient found to have large right pneumothorax. Chest tube emergently placed and hooked to -20 suction. O2 recovered to mid to upper 80s after placement.   Orders received to pull low right lateral chest tube- removed and 1845.

## 2020-05-20 NOTE — Procedures (Signed)
Insertion of Chest Tube Procedure Note  GABRIELA IRIGOYEN  [id number]  1961/01/29  Date:05/20/20  Time:12:35 PM    Provider Performing: Shelby Mattocks   Procedure: Pleural Catheter Insertion w/o Imaging Guidance (72902)  Indication(s) Pneumothorax  Consent Unable to obtain consent due to emergent nature of procedure.  Anesthesia Topical only with 1% lidocaine    Time Out Verified patient identification, verified procedure, site/side was marked, verified correct patient position, special equipment/implants available, medications/allergies/relevant history reviewed, required imaging and test results available.   Sterile Technique Maximal sterile technique including full sterile barrier drape, hand hygiene, sterile gown, sterile gloves, mask, hair covering, sterile ultrasound probe cover (if used).   Procedure Description Ultrasound used to identify appropriate pleural anatomy for placement and overlying skin marked. Area of placement cleaned and draped in sterile fashion.  A 14 French pigtail pleural catheter was placed into the right pleural space using Seldinger technique. Appropriate return of air was obtained.  The tube was connected to atrium and placed on -20 cm H2O wall suction.   Complications/Tolerance None; patient tolerated the procedure well. Chest X-ray is ordered to verify placement.   EBL Minimal  Specimen(s) none   [name] ACNP-BC [facility] Pulmonary/Critical Care Pager # [phone] OR # [phone] if no answer

## 2020-05-20 NOTE — TOC Progression Note (Signed)
Transition of Care [facility]) - Progression Note    Patient Details  Name: Donna Campbell MRN: [id number] Date of Birth: 20-Oct-1960  [facility] CM/SW Contact  Golda Acre, RN Phone Number: 05/20/2020, 8:43 AM  Clinical Narrative:    Remains on the vent at 100% Fi02,iv sedation and paralytic, ARDS, chest tube in place, bun 171 creat 4.50 nephrology seeing. Following for progression and toc needs/may need ltach once stable.  Expected Discharge Plan: Home/Self Care Barriers to Discharge: Continued Medical Work up  Expected Discharge Plan and Services Expected Discharge Plan: Home/Self Care   Discharge Planning Services: CM Consult   Living arrangements for the past 2 months: Single Family Home                                       Social Determinants of Health (SDOH) Interventions    Readmission Risk Interventions No flowsheet data found.

## 2020-05-20 NOTE — Procedures (Signed)
Central Venous Catheter Insertion Procedure Note  Donna Campbell  [id number]  1961/06/05  Date:05/20/20  Time:12:16 PM   Provider Performing:Pete [name]   Procedure: Insertion of Non-tunneled Central Venous Catheter(36556)with US guidance (18403)    Indication(s) Hemodialysis  Consent Risks of the procedure as well as the alternatives and risks of each were explained to the patient and/or caregiver.  Consent for the procedure was obtained and is signed in the bedside chart  Anesthesia Topical only with 1% lidocaine   Timeout Verified patient identification, verified procedure, site/side was marked, verified correct patient position, special equipment/implants available, medications/allergies/relevant history reviewed, required imaging and test results available.  Sterile Technique Maximal sterile technique including full sterile barrier drape, hand hygiene, sterile gown, sterile gloves, mask, hair covering, sterile ultrasound probe cover (if used).  Procedure Description Area of catheter insertion was cleaned with chlorhexidine and draped in sterile fashion.   With real-time ultrasound guidance a HD catheter was placed into the right internal jugular vein.  Nonpulsatile blood flow and easy flushing noted in all ports.  The catheter was sutured in place and sterile dressing applied.  Complications/Tolerance None; patient tolerated the procedure well. Chest X-ray is ordered to verify placement for internal jugular or subclavian cannulation.  Chest x-ray is not ordered for femoral cannulation.  EBL Minimal  Specimen(s) None  [name] ACNP-BC [facility] Pulmonary/Critical Care Pager # [phone] OR # [phone] if no answer

## 2020-05-21 LAB — RENAL FUNCTION PANEL
Albumin: 2.8 g/dL — ABNORMAL LOW (ref 3.5–5.0)
Anion gap: 10 (ref 5–15)
BUN: 108 mg/dL — ABNORMAL HIGH (ref 6–20)
CO2: 24 mmol/L (ref 22–32)
Calcium: 7.1 mg/dL — ABNORMAL LOW (ref 8.9–10.3)
Chloride: 98 mmol/L (ref 98–111)
Creatinine, Ser: 2.53 mg/dL — ABNORMAL HIGH (ref 0.44–1.00)
GFR calc Af Amer: 23 mL/min — ABNORMAL LOW (ref >60)
GFR calc non Af Amer: 20 mL/min — ABNORMAL LOW (ref >60)
Glucose, Bld: 228 mg/dL — ABNORMAL HIGH (ref 70–99)
Phosphorus: 6.8 mg/dL — ABNORMAL HIGH (ref 2.5–4.6)
Potassium: 4.6 mmol/L (ref 3.5–5.1)
Sodium: 132 mmol/L — ABNORMAL LOW (ref 135–145)

## 2020-05-21 LAB — GLUCOSE, CAPILLARY: Glucose-Capillary: 221 mg/dL — ABNORMAL HIGH (ref 70–99)

## 2020-05-21 LAB — MAGNESIUM: Magnesium: 3 mg/dL — ABNORMAL HIGH (ref 1.7–2.4)

## 2020-05-21 LAB — APTT: aPTT: 32 seconds (ref 24–36)

## 2020-05-21 MED FILL — Medication: Qty: 1 | Status: AC

## 2020-06-08 NOTE — Progress Notes (Signed)
Around 2300, pt's HR dropped to the 20s. 1 mg of Atropine was given and the patient HR resumed back to where it had been in the 70s. Shortly after the patient began to endorse a HR in the 130s. An EKG was performed which confirmed the patient was in A-fib. Ogan MD was notified. RN to notify MD if HR sustains high.

## 2020-06-08 NOTE — Progress Notes (Signed)
eLink Physician-Brief Progress Note Patient Name: Donna Campbell DOB: 08-11-1961 MRN: [id number]   Date of Service  05/26/20  HPI/Events of Note  I was notified of a code blue in room WL 1227, on entering the room via camera CPR in progress, there is a physician in the room running the code. I notified the PCCM ground crew to go to the code.  eICU Interventions  See above.        Thomasene Lot Marshe Shrestha 05/26/20, 5:54 AM

## 2020-06-08 NOTE — Progress Notes (Signed)
CRITICAL EVENT  Code blue was called.   Patient had gone into ventricular fib and lost a pulse.  No defibrillation done, but CPR started. Patient received rounds of epi and bicarb. Please see full nursing code note.  I personally called to speak with Mr. Donna Campbell. We discussed what was happening, and the poor prognosis. During the conversation patient achieved ROSC. It was decided that patient would be DNR from here on out. Mr. Donna Campbell encouraged to come see her.

## 2020-06-08 NOTE — Progress Notes (Signed)
PRN Chaplain was paged for EOL for this patient.  Chaplain arrived and spouse-Richard was in the room.  Patient had been pronounced. Chaplain went into the consult room to offer support for Richard as he did not know which way to turn.  He is feeling some guilt wishing he had tried harder for patient to get the vaccine.  He was in touch with their son and patient's sisters.  They arrived, Chaplain offered prayer and escorted them to put on the PPE to be with the patient.  Chaplain provided grief, emotional support, ministry of presence and rapport building.  Chaplain available as needed for support.  Chaplain will remain with the family until they are ready to go.   Chaplain Agustin Cree, MDiv.     05/25/2020 0900  Clinical Encounter Type  Visited With Family;Health care provider  Visit Type Code;Death;Psychological support;Spiritual support  Referral From Nurse  Consult/Referral To Chaplain  Spiritual Encounters  Spiritual Needs Grief support;Emotional;Prayer  Stress Factors  Family Stress Factors Major life changes

## 2020-06-08 NOTE — ED Provider Notes (Signed)
Department of Emergency Medicine   Code Blue CONSULT NOTE  Chief Complaint: Cardiac arrest/unresponsive   Level V Caveat: Unresponsive  History of present illness: I was contacted by the hospital for a CODE BLUE cardiac arrest upstairs and presented to the patient's bedside.   ROS: Unable to obtain, Level V caveat  Scheduled Meds: . artificial tears  1 application Both Eyes C1U  . chlorhexidine gluconate (MEDLINE KIT)  15 mL Mouth Rinse BID  . Chlorhexidine Gluconate Cloth  6 each Topical Daily  . docusate  100 mg Per Tube BID  . enoxaparin (LOVENOX) injection  30 mg Subcutaneous Daily  . feeding supplement (PROSource TF)  45 mL Per Tube 5 X Daily  . free water  100 mL Per Tube Q4H  . insulin aspart  0-20 Units Subcutaneous Q4H  . insulin detemir  25 Units Subcutaneous BID  . mouth rinse  15 mL Mouth Rinse 10 times per day  . methylPREDNISolone (SOLU-MEDROL) injection  60 mg Intravenous BID  . metoCLOPramide (REGLAN) injection  5 mg Intravenous Q6H  . multivitamin  15 mL Per Tube QHS  . pantoprazole (PROTONIX) IV  40 mg Intravenous QHS  . polyethylene glycol  17 g Per Tube Daily  . sennosides  5 mL Per Tube BID  . sevelamer carbonate  800 mg Oral TID WC  . sodium chloride flush  10 mL Intracatheter Q8H  . sodium chloride flush  10 mL Intracatheter Q8H  . sodium chloride flush  10-40 mL Intracatheter Q12H  . venlafaxine  37.5 mg Per Tube BID   Continuous Infusions: .  prismasol BGK 4/2.5 400 mL/hr at 05/20/20 1427  .  prismasol BGK 4/2.5 200 mL/hr at 05/20/20 1427  . sodium chloride Stopped (05/17/20 2304)  . cisatracurium (NIMBEX) infusion 3.5 mcg/kg/min (09-Jun-2020 0500)  . feeding supplement (NEPRO CARB STEADY) 50 mL/hr at 05/20/20 2200  . fentaNYL infusion INTRAVENOUS Stopped (05/13/20 1331)  . HYDROmorphone 4 mg/hr (06-09-2020 0500)  . midazolam Stopped (06-09-2020 0454)  . prismasol BGK 4/2.5 2,000 mL/hr at 05/20/20 1718   PRN Meds:.sodium chloride, alteplase,  fentaNYL, heparin, hydrALAZINE, HYDROmorphone, labetalol, midazolam, sodium chloride, sodium chloride flush Past Medical History:  Diagnosis Date  . Allergy   . Asthma   . GERD (gastroesophageal reflux disease)   . Shortness of breath    sometimes on exertion   Past Surgical History:  Procedure Laterality Date  . CHOLECYSTECTOMY    . COLONOSCOPY    . SALPINGOOPHORECTOMY  07/30/2012   Procedure: SALPINGO OOPHERECTOMY;  Surgeon: Maeola Sarah. Landry Mellow, MD;  Location: [facility];  Service: Gynecology;  Laterality: Left;  . TONSILLECTOMY    . VAGINAL HYSTERECTOMY  07/30/2012   Procedure: HYSTERECTOMY VAGINAL;  Surgeon: Maeola Sarah. Landry Mellow, MD;  Location: [facility];  Service: Gynecology;  Laterality: N/A;  . VARICOSE VEIN SURGERY     Social History   Socioeconomic History  . Marital status: Married    Spouse name: Not on file  . Number of children: Not on file  . Years of education: Not on file  . Highest education level: Not on file  Occupational History  . Not on file  Tobacco Use  . Smoking status: Never Smoker  . Smokeless tobacco: Never Used  Substance and Sexual Activity  . Alcohol use: No  . Drug use: No  . Sexual activity: Not on file  Other Topics Concern  . Not on file  Social History Narrative  . Not on file   Social Determinants of  Health   Financial Resource Strain:   . Difficulty of Paying Living Expenses:   Food Insecurity:   . Worried About Charity fundraiser in the Last Year:   . Arboriculturist in the Last Year:   Transportation Needs:   . Film/video editor (Medical):   Marland Kitchen Lack of Transportation (Non-Medical):   Physical Activity:   . Days of Exercise per Week:   . Minutes of Exercise per Session:   Stress:   . Feeling of Stress :   Social Connections:   . Frequency of Communication with Friends and Family:   . Frequency of Social Gatherings with Friends and Family:   . Attends Religious Services:   . Active Member of Clubs or Organizations:   . Attends Theatre manager Meetings:   Marland Kitchen Marital Status:   Intimate Partner Violence:   . Fear of Current or Ex-Partner:   . Emotionally Abused:   Marland Kitchen Physically Abused:   . Sexually Abused:    Allergies  Allergen Reactions  . Naproxen Hives    Pt says she can take ibuprofen  . Sulfonamide Derivatives Hives    Last set of Vital Signs (not current) Vitals:   2020/06/07 0400 June 07, 2020 0500  BP: (!) 162/84 (!) 167/95  Pulse: 80 (!) 39  Resp: (!) 26 (!) 26  Temp:    SpO2: 92% 100%      Physical Exam  Gen: unresponsive. CPR in progress.  Cardiovascular: pulseless  Resp: apneic. Breath sounds equal bilaterally with bagging  Abd: nondistended  Neuro: GCS 3, unresponsive to pain  HEENT: No blood in posterior pharynx Musculoskeletal: No deformity  Skin: cool to touch.   Procedures   Cardiopulmonary Resuscitation (CPR) Procedure Note  Directed/Performed by: Margette Fast I personally directed ancillary staff and/or performed CPR in an effort to regain return of spontaneous circulation and to maintain cardiac, neuro and systemic perfusion.    Medical Decision making  COVID positive and intubated. CPR in progress. On my arrival patient has received 1 defib V-fib. ACLS in progress with continuous CPR. On pulse check ROSC achieved. See Dr. Clearence Ped note for goals of care discussion with family.   Assessment and Plan  DNR moving forward. Reviewed 3AM labs and most recent EKG. Continue care per critical care team.   Nanda Quinton, MD Emergency Medicine    Reeve Turnley, [facility], MD 06-07-2020 [phone]

## 2020-06-08 NOTE — Progress Notes (Signed)
Patient began endorsing v-fib on the monitor around 0542 this AM. No pulse was palpated and a code blue was called. ROSC was obtained at 0555. Pt was made a DNR at this time.

## 2020-06-08 NOTE — Progress Notes (Signed)
RN spoke to husband and informed him of found patient belongings. Husband to come to hospital and get belongings today.

## 2020-06-08 NOTE — Discharge Summary (Signed)
Physician Death Summary  Patient ID: Donna Campbell MRN: [id number] DOB/AGE: Mar 04, 1961 59 y.o.  Admit date: 04/29/2020 Discharge date: 06-04-2020  Admission Diagnoses:  Acute respiratory failure secondary to COVID-19  Discharge Diagnoses:  Severe acute respiratory distress syndrome COVID-19 pneumonia Right pneumothorax Acute kidney injury needing CVVH  Discharged Condition: Deceased  Hospital Course:  59 year old unvaccinated female presented to the [facility] long emergency room with dyspnea on 7/25.  She had been diagnosed with COVID on 7/20 at an urgent care.  She was intubated in the evening of 7/25.  She remained in the ICU with severe ARDS secondary to COVID-19 pneumonia requiring paralysis, proning.  She had recurrent vomiting episodes while being prone which limited the inability to use this modality. Developed large right pneumothorax on 8/8 requiring chest tube placement.  She also had a significant desaturation episode while being prone with increasing pneumothorax and return back to supine position on 8/11.   Developed progressive kidney injury and CVVH initiated on 8/13.  On the night of 8/14 she had bradycardia alternating with atrial fibrillation with RVR.  She went into asystolic arrest later that night and was resuscitated.  Family informed and elected to make DNR.  She had another asystolic event later that day and passed away.  Her husband Donna Campbell was at bedside.  Chilton Greathouse MD [facility] Please see [email or web address] for pager details.  05/23/2020, 3:13 PM

## 2020-06-08 NOTE — Progress Notes (Signed)
120 ml of Dilaudid and 25 mL of Versed wasted by this RN and [employer], [employer].

## 2020-06-08 NOTE — Progress Notes (Addendum)
Pt showing asystole on the monitor. No breath sounds auscultated or pulse palpated for one full minute by two RNs, Marvion Bastidas and [employer]. Time of death pronounced at 0700. Husband at bedside. Emotional support provided by this RN.

## 2020-06-08 DEATH — deceased

## 2021-08-08 IMAGING — DX DG ABDOMEN 1V
1 series · 1 of 1 positions shown · non-contrast
Comparison: Radiograph earlier this day.

CLINICAL DATA: New OG tube.

EXAM:
ABDOMEN - 1 VIEW

[abdomen kub]
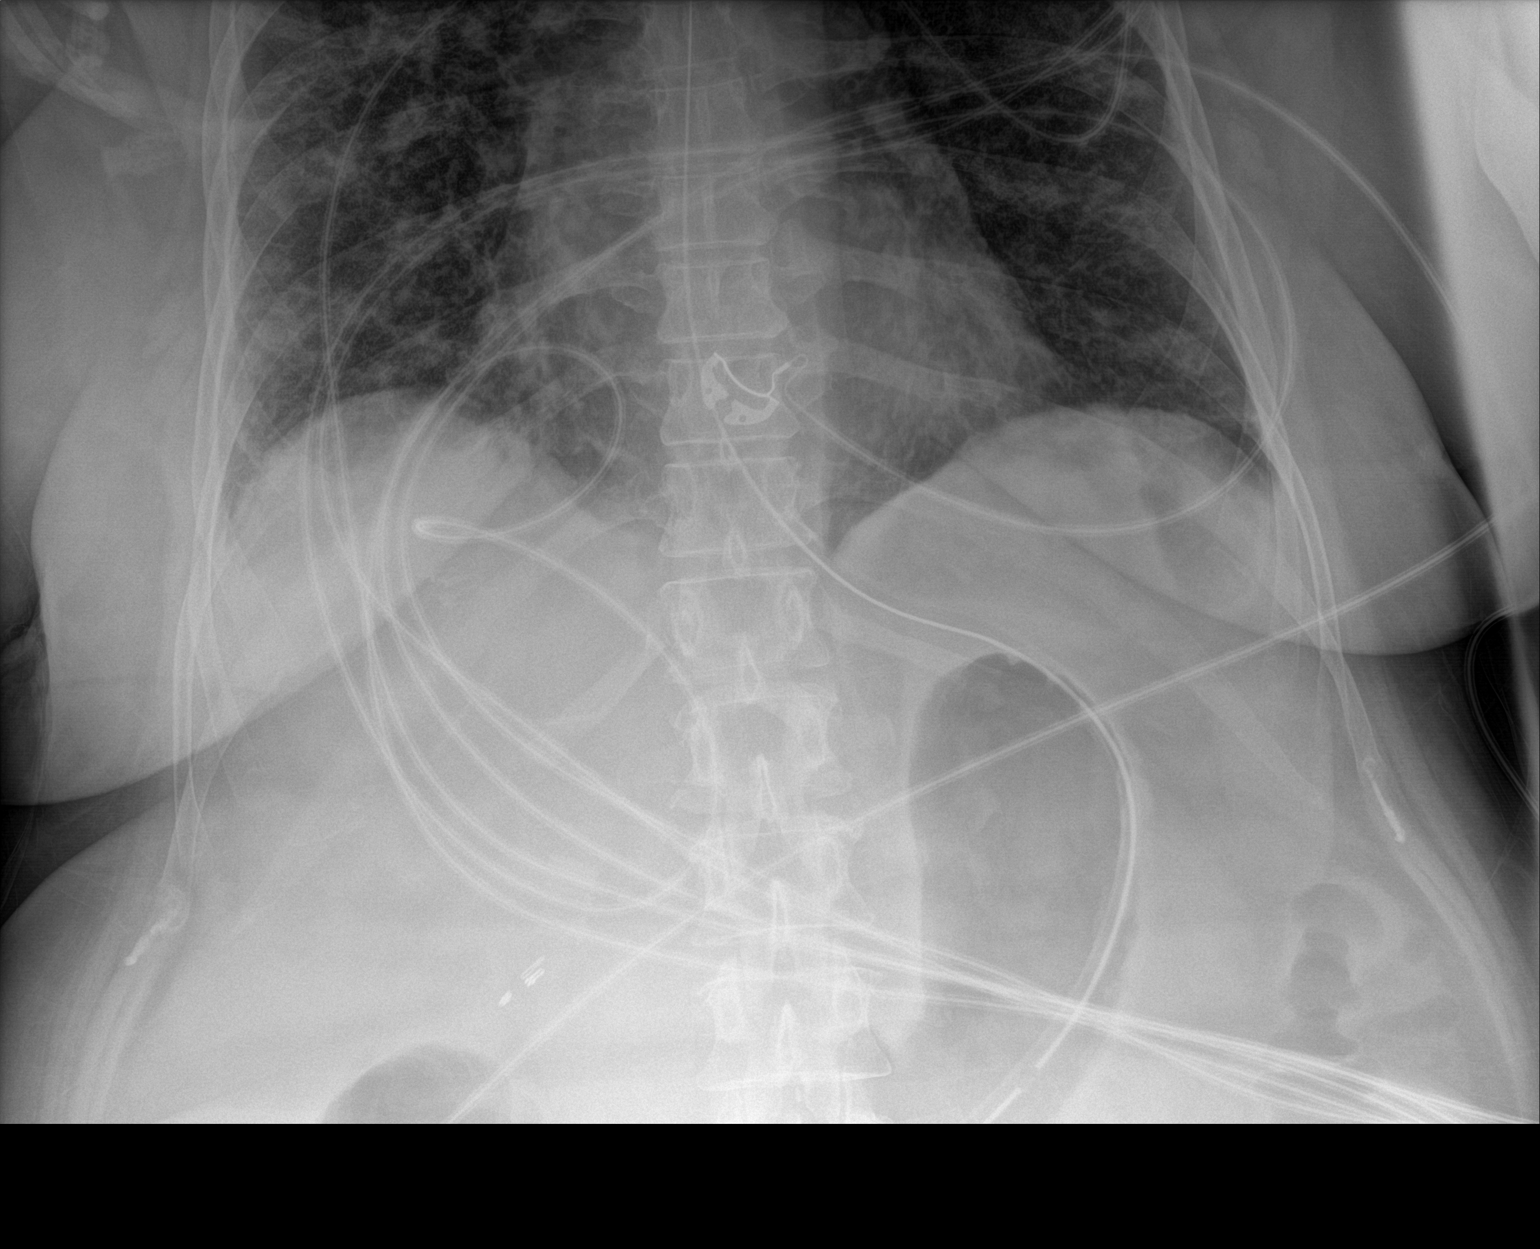

[1 of 1 positions shown; findings below may reference images not displayed]

FINDINGS: Tip and side port of the enteric tube below the diaphragm in the
stomach. Mild gaseous gastric distension. Air-filled bowel loops in
the upper abdomen, not abnormally distended where visualized.
Cholecystectomy clips in the right upper quadrant.
IMPRESSION: Tip and side port of the enteric tube below the diaphragm in the
stomach.

## 2021-08-08 IMAGING — DX DG ABDOMEN 1V
1 series · 1 of 1 positions shown · non-contrast
Comparison: May 01, 2020.

CLINICAL DATA: Orogastric tube placement.

EXAM:
ABDOMEN - 1 VIEW

[abdomen kub]
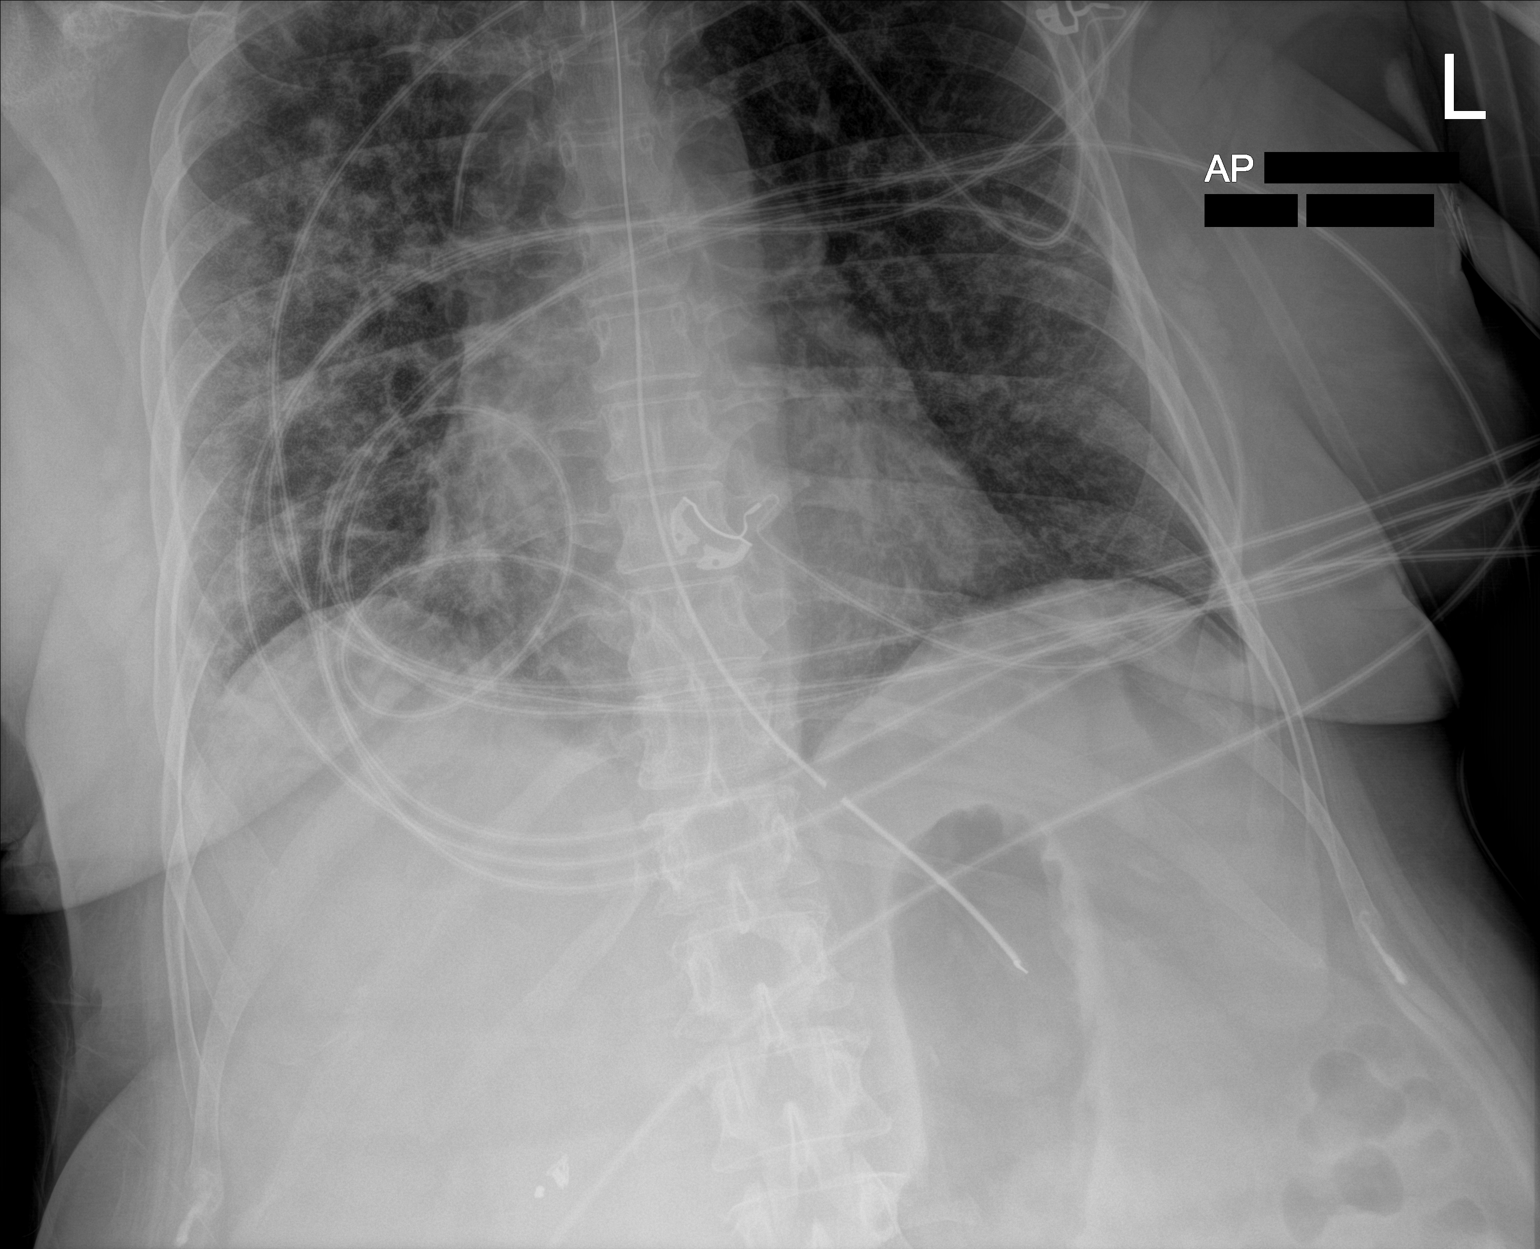

[1 of 1 positions shown; findings below may reference images not displayed]

FINDINGS: Distal tip of enteric tube is seen in the proximal stomach. No
abnormal bowel dilatation is noted.
IMPRESSION: Distal tip of enteric tube is seen in the proximal stomach.

## 2021-08-09 IMAGING — DX DG CHEST 1V PORT
1 series · 1 of 1 positions shown · non-contrast
Comparison: 05/01/2020.

CLINICAL DATA: Respiratory failure.

EXAM:
PORTABLE CHEST 1 VIEW

[chest ap]
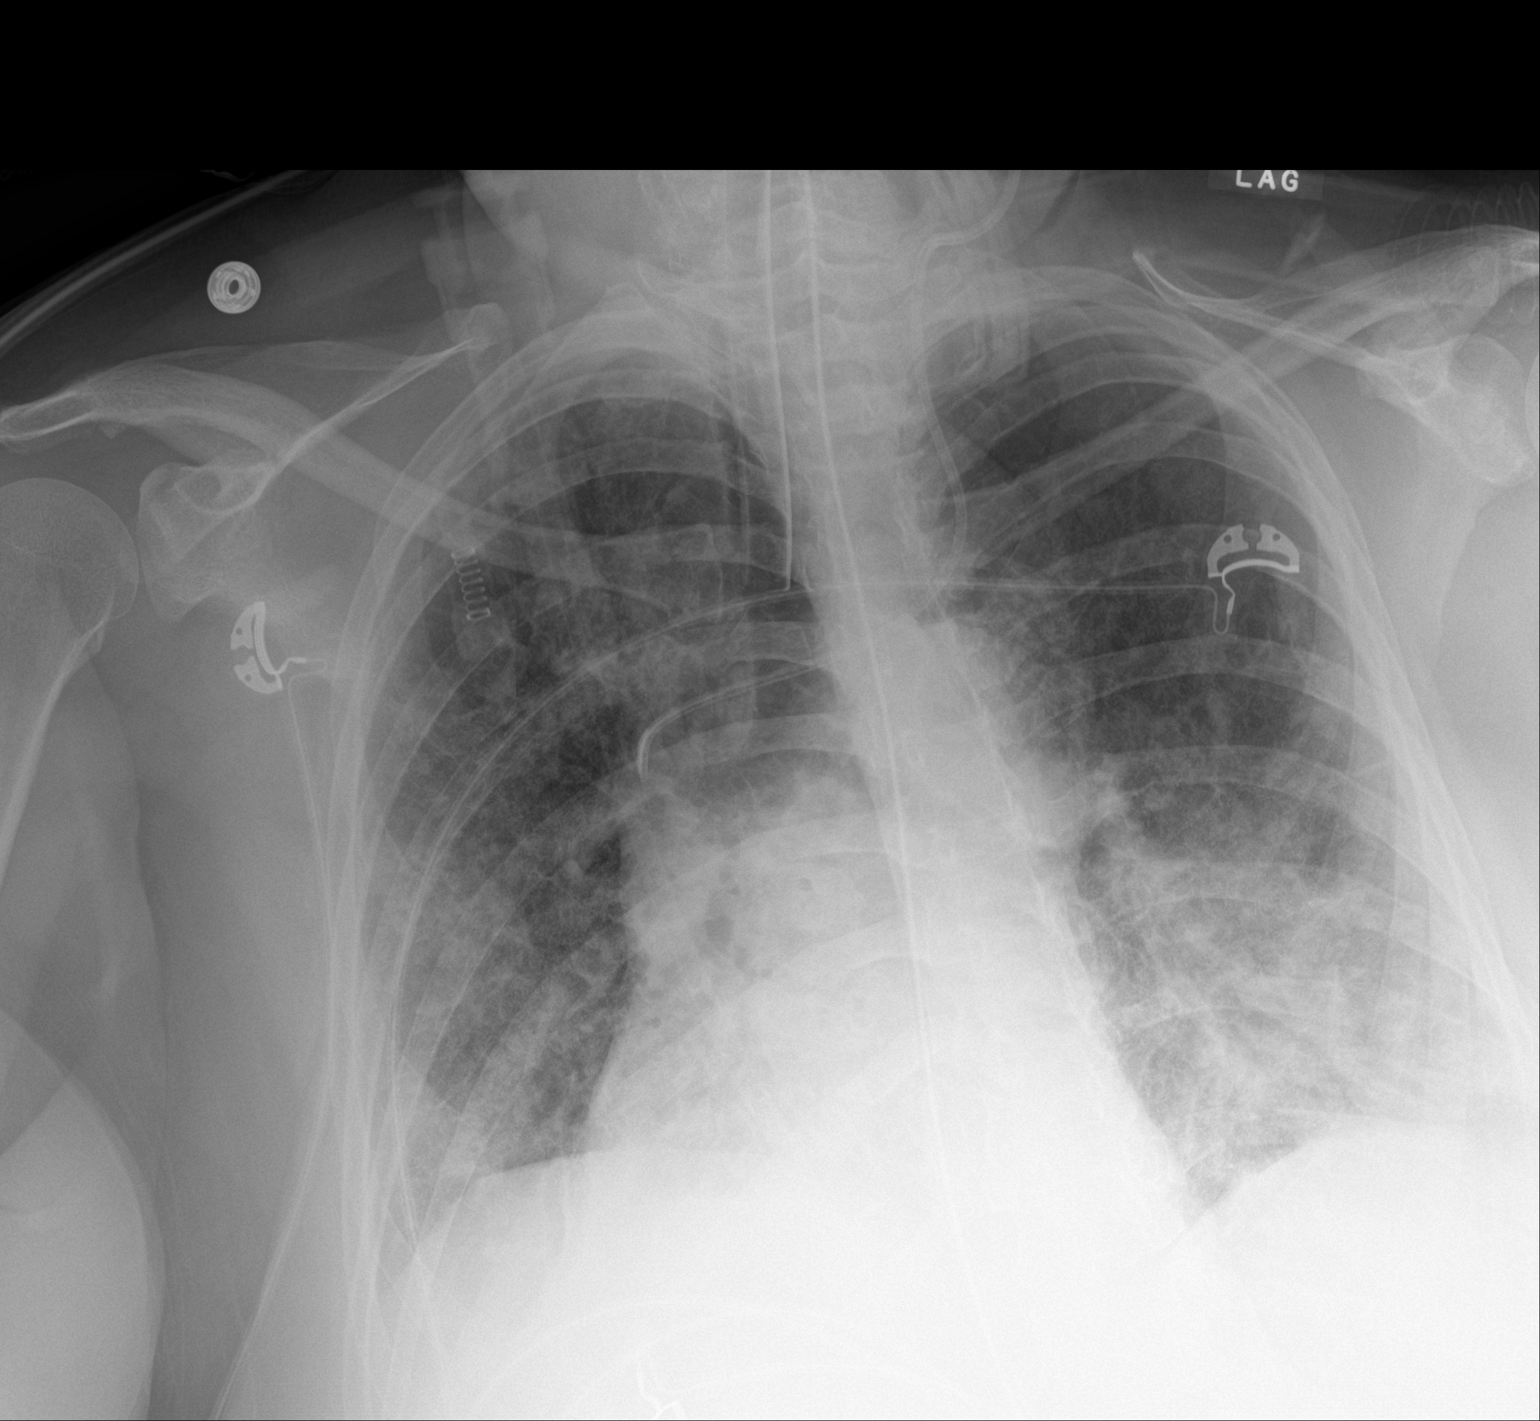

[1 of 1 positions shown; findings below may reference images not displayed]

FINDINGS: Endotracheal tube, NG tube, left IJ line stable position. Heart size
stable. Persistent diffuse bilateral pulmonary infiltrates/edema.
Similar findings noted on prior exam. No prominent pleural effusion.
No pneumothorax. Left costophrenic angle incompletely imaged. No
acute bony abnormality.
IMPRESSION: 1.  Lines and tubes stable position.

2. Persistent diffuse bilateral pulmonary infiltrates/edema. Similar
findings noted on prior exam.

## 2021-08-19 IMAGING — DX DG ABD PORTABLE 1V
2 series · 2 of 2 positions shown · non-contrast
Comparison: None.

CLINICAL DATA: NG tube

EXAM:
PORTABLE ABDOMEN - 1 VIEW

[abdomen kub (1 of 2)]
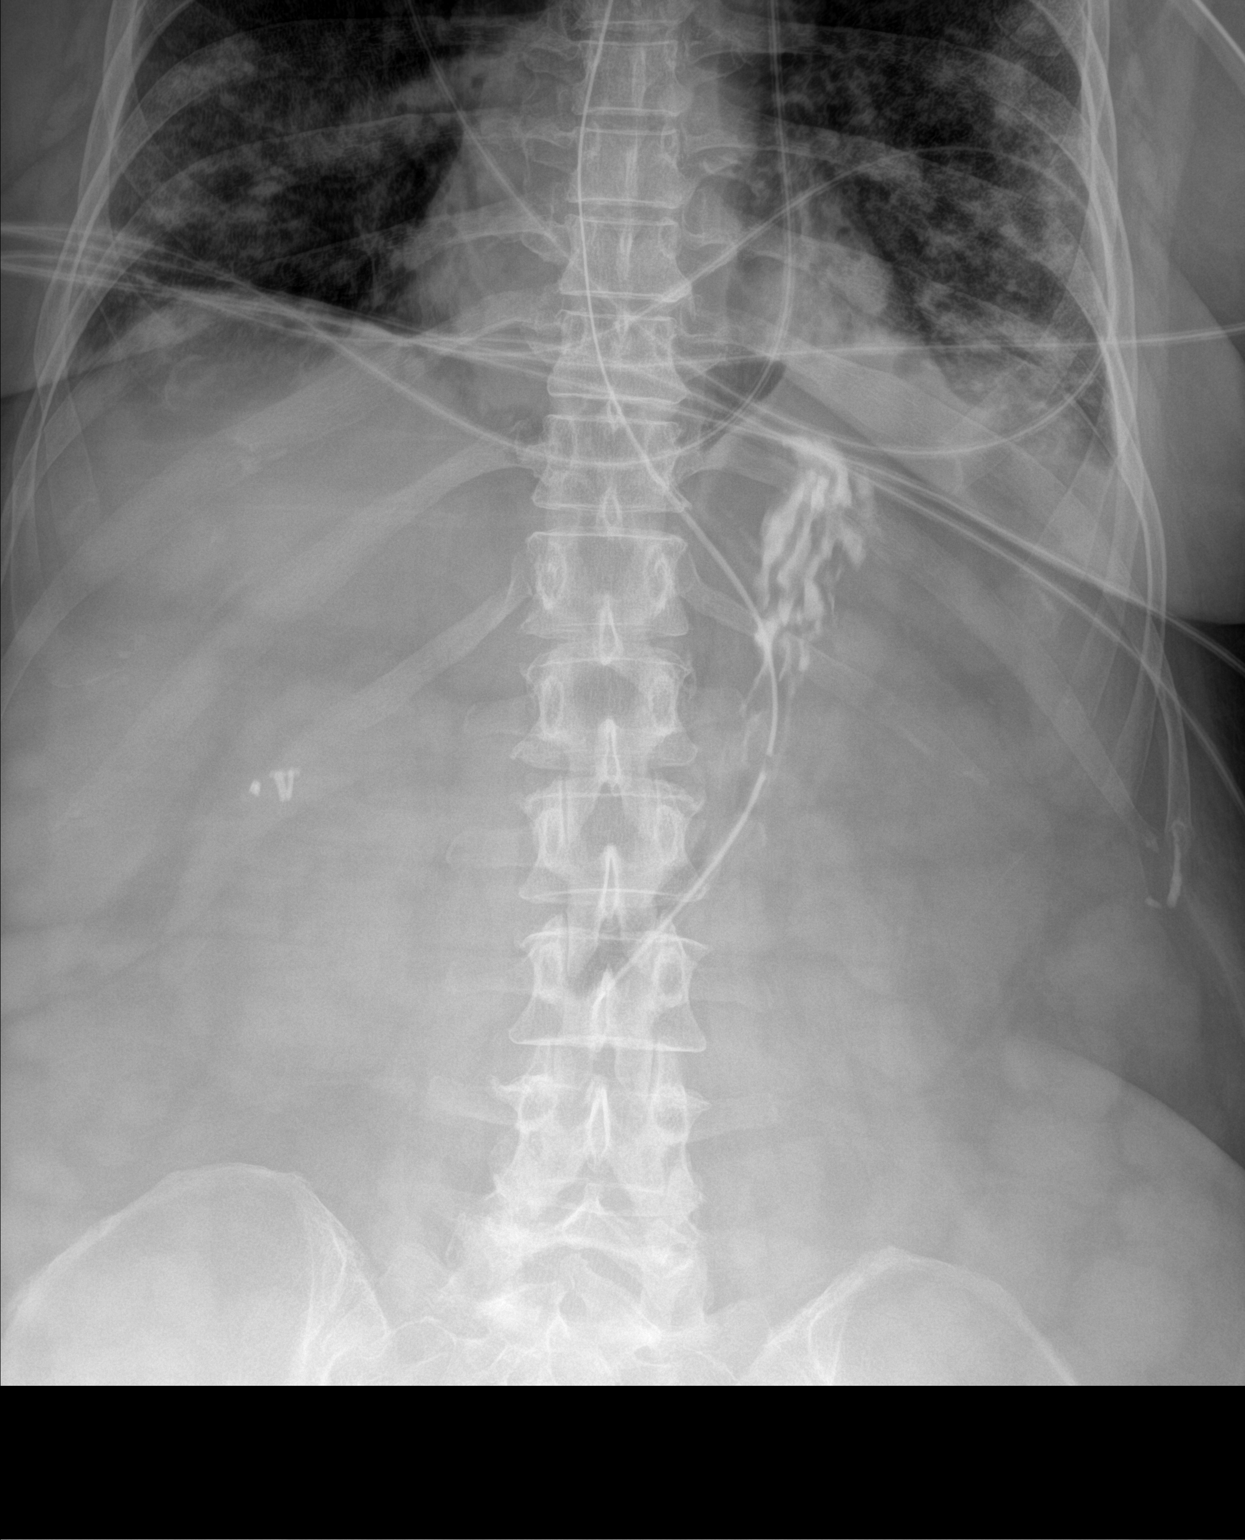

[abdomen kub (2 of 2)]
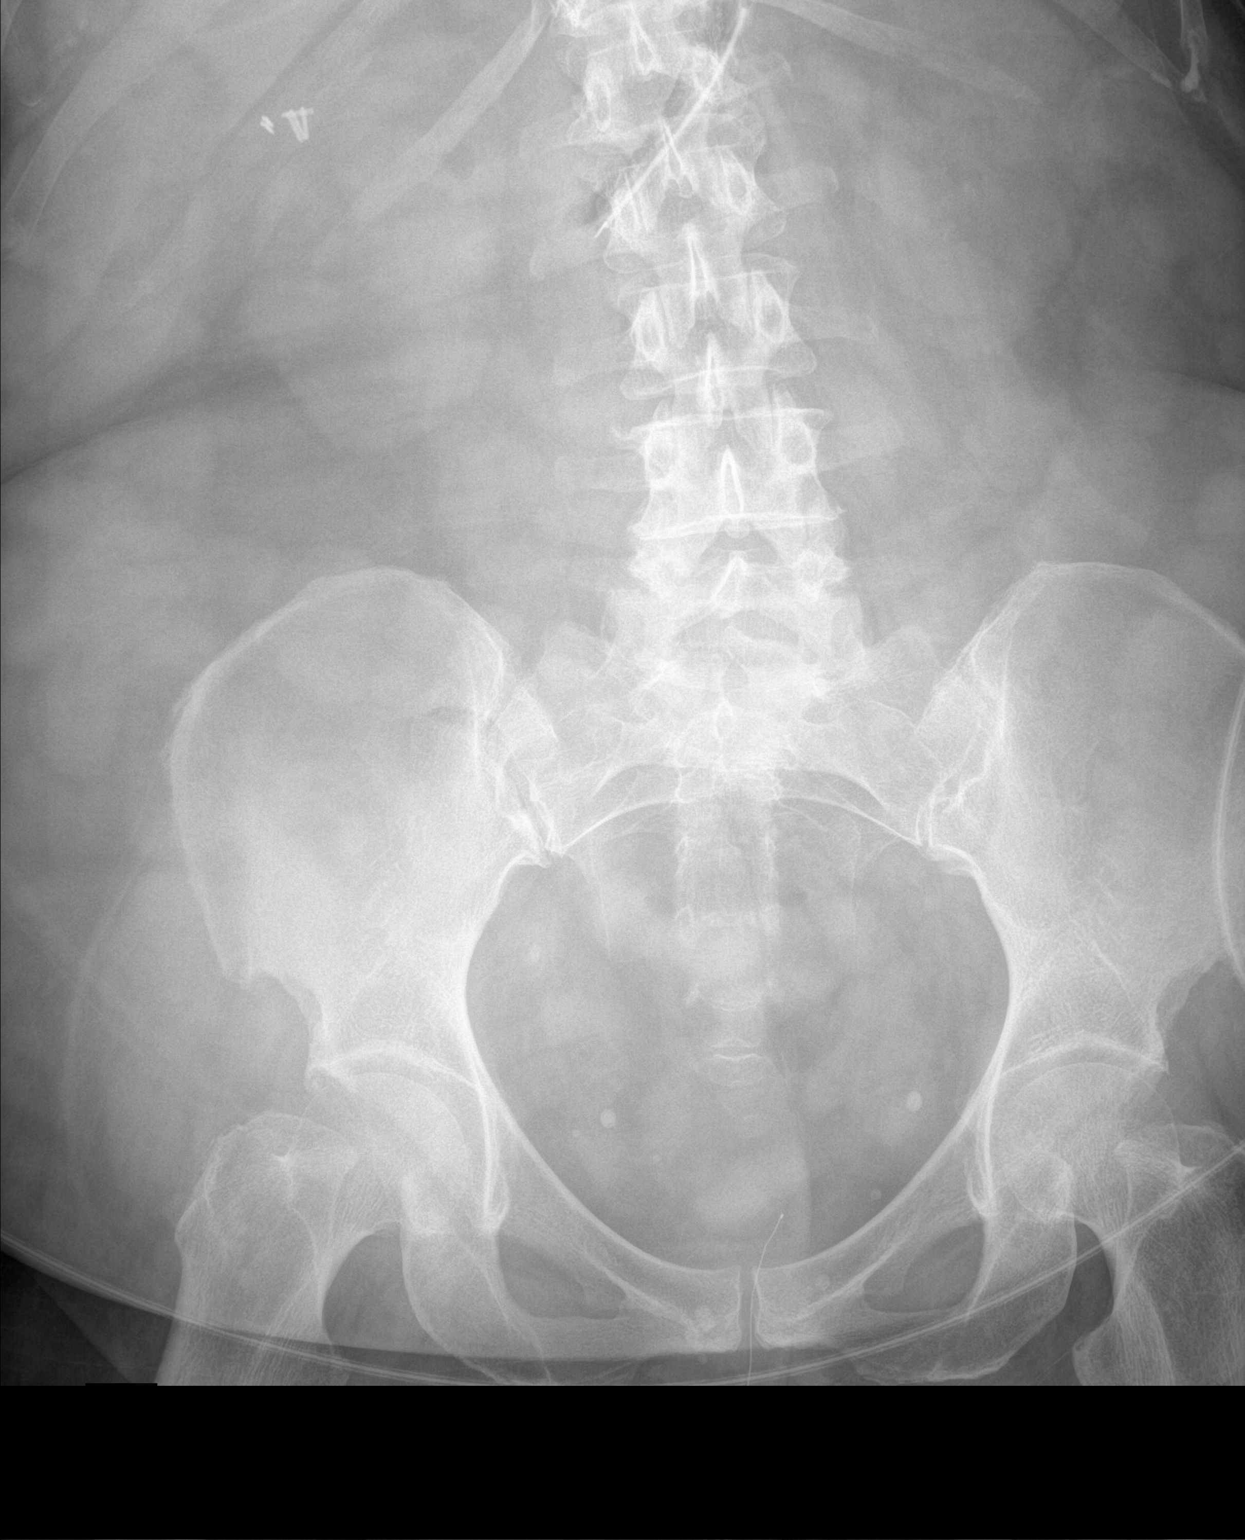

[2 of 2 positions shown; findings below may reference images not displayed]

FINDINGS: Tip the NG tube seen within the mid body of the stomach. A small
amount of retained contrast seen within the gastric fundus. Surgical
clips in the right upper quadrant.
IMPRESSION: Tip of the NG tube within the mid body of the stomach.

## 2021-08-19 IMAGING — DX DG CHEST 1V PORT
1 series · 1 of 1 positions shown · non-contrast
Comparison: 05/12/2020.

CLINICAL DATA: COVID.  Respiratory failure.

EXAM:
PORTABLE CHEST 1 VIEW

[chest ap]
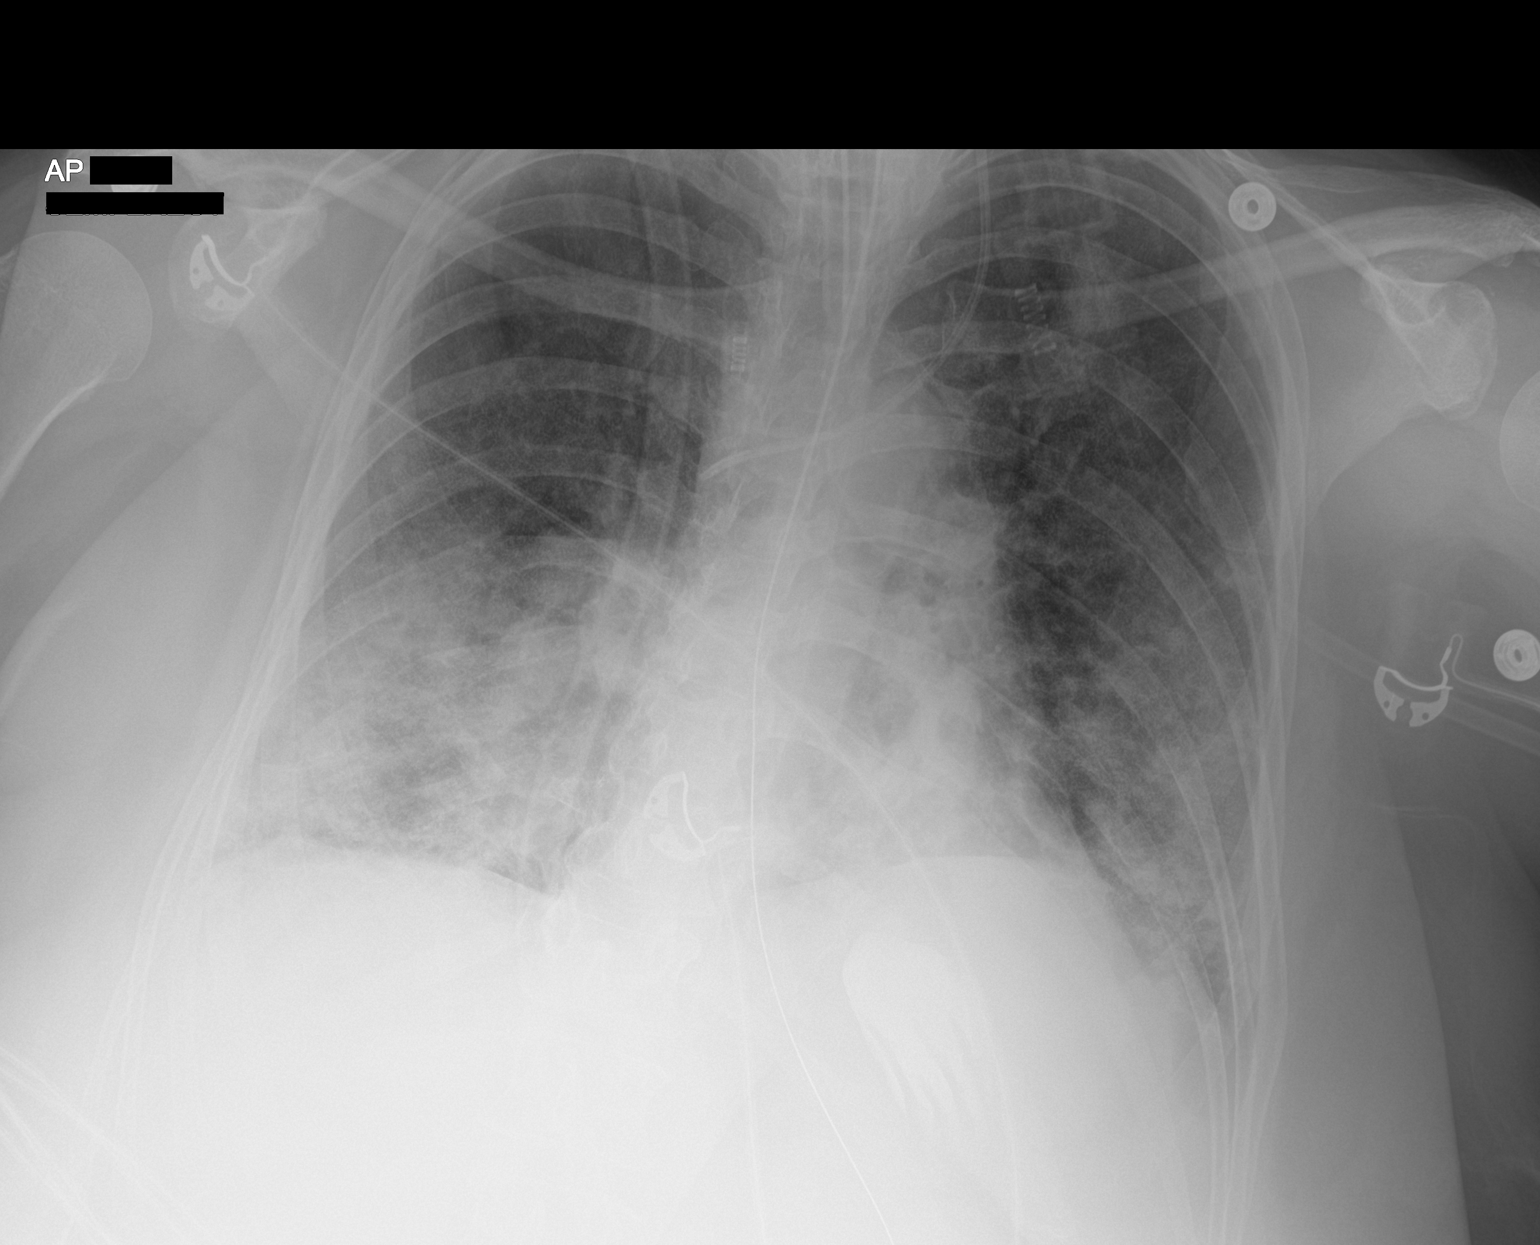

[1 of 1 positions shown; findings below may reference images not displayed]

FINDINGS: Endotracheal tube, left IJ line, NG tube in stable position. Heart
size normal. Diffuse bilateral pulmonary infiltrates again noted. No
significant change. No pleural effusion or pneumothorax. Oral
contrast in the stomach. No acute bony abnormality identified.
IMPRESSION: 1.  Lines and tubes in stable position.

2. Diffuse bilateral pulmonary infiltrates again noted without
significant change.
